# Patient Record
Sex: Female | Born: 1970 | Race: White | Hispanic: No | Smoking: Never smoker
Health system: Southern US, Community
[De-identification: ages and names within clinical notes are randomized; demographics above are authoritative.]

## PROBLEM LIST (undated history)

## (undated) DIAGNOSIS — R87619 Unspecified abnormal cytological findings in specimens from cervix uteri: Secondary | ICD-10-CM

## (undated) DIAGNOSIS — E079 Disorder of thyroid, unspecified: Secondary | ICD-10-CM

## (undated) DIAGNOSIS — O09529 Supervision of elderly multigravida, unspecified trimester: Secondary | ICD-10-CM

## (undated) DIAGNOSIS — F419 Anxiety disorder, unspecified: Secondary | ICD-10-CM

## (undated) DIAGNOSIS — E0590 Thyrotoxicosis, unspecified without thyrotoxic crisis or storm: Secondary | ICD-10-CM

## (undated) DIAGNOSIS — A630 Anogenital (venereal) warts: Secondary | ICD-10-CM

## (undated) DIAGNOSIS — IMO0002 Reserved for concepts with insufficient information to code with codable children: Secondary | ICD-10-CM

## (undated) DIAGNOSIS — N970 Female infertility associated with anovulation: Secondary | ICD-10-CM

## (undated) DIAGNOSIS — Z3189 Encounter for other procreative management: Secondary | ICD-10-CM

## (undated) HISTORY — DX: Disorder of thyroid, unspecified: E07.9

## (undated) HISTORY — DX: Encounter for other procreative management: Z31.89

## (undated) HISTORY — DX: Supervision of elderly multigravida, unspecified trimester: O09.529

## (undated) HISTORY — DX: Reserved for concepts with insufficient information to code with codable children: IMO0002

## (undated) HISTORY — PX: HYSTEROSCOPY: SHX211

## (undated) HISTORY — DX: Anogenital (venereal) warts: A63.0

## (undated) HISTORY — DX: Female infertility associated with anovulation: N97.0

## (undated) HISTORY — DX: Unspecified abnormal cytological findings in specimens from cervix uteri: R87.619

---

## 2005-06-24 ENCOUNTER — Other Ambulatory Visit: Admission: RE | Admit: 2005-06-24 | Discharge: 2005-06-24 | Payer: Self-pay | Admitting: Family Medicine

## 2006-06-09 ENCOUNTER — Emergency Department (HOSPITAL_COMMUNITY): Admission: EM | Admit: 2006-06-09 | Discharge: 2006-06-09 | Payer: Self-pay | Admitting: Emergency Medicine

## 2006-12-26 ENCOUNTER — Other Ambulatory Visit: Admission: RE | Admit: 2006-12-26 | Discharge: 2006-12-26 | Payer: Self-pay | Admitting: Family Medicine

## 2007-02-24 ENCOUNTER — Emergency Department (HOSPITAL_COMMUNITY): Admission: EM | Admit: 2007-02-24 | Discharge: 2007-02-25 | Payer: Self-pay | Admitting: Emergency Medicine

## 2011-02-15 ENCOUNTER — Other Ambulatory Visit (HOSPITAL_COMMUNITY): Payer: Self-pay | Admitting: Obstetrics and Gynecology

## 2011-02-15 DIAGNOSIS — Z1231 Encounter for screening mammogram for malignant neoplasm of breast: Secondary | ICD-10-CM

## 2011-02-26 ENCOUNTER — Ambulatory Visit (HOSPITAL_COMMUNITY)
Admission: RE | Admit: 2011-02-26 | Discharge: 2011-02-26 | Disposition: A | Discharge status: Home / Self Care | Payer: BC Managed Care – PPO | Source: Ambulatory Visit | Attending: Obstetrics and Gynecology | Admitting: Obstetrics and Gynecology

## 2011-02-26 DIAGNOSIS — Z1231 Encounter for screening mammogram for malignant neoplasm of breast: Secondary | ICD-10-CM | POA: Insufficient documentation

## 2011-03-02 ENCOUNTER — Ambulatory Visit (HOSPITAL_COMMUNITY): Payer: Self-pay

## 2011-03-03 ENCOUNTER — Ambulatory Visit (HOSPITAL_COMMUNITY): Payer: Self-pay

## 2011-04-12 DIAGNOSIS — N856 Intrauterine synechiae: Secondary | ICD-10-CM | POA: Insufficient documentation

## 2011-05-03 LAB — RAPID URINE DRUG SCREEN, HOSP PERFORMED
Amphetamines: NOT DETECTED
Barbiturates: NOT DETECTED
Benzodiazepines: POSITIVE — AB
Opiates: NOT DETECTED

## 2011-05-03 LAB — CBC
Hemoglobin: 12.1
RBC: 3.94
WBC: 7.5

## 2011-05-03 LAB — DIFFERENTIAL
Lymphocytes Relative: 19
Lymphs Abs: 1.4
Monocytes Relative: 4
Neutro Abs: 5.7
Neutrophils Relative %: 76

## 2011-05-03 LAB — BASIC METABOLIC PANEL
Calcium: 8.6
Creatinine, Ser: 0.76
GFR calc Af Amer: >60
Sodium: 141

## 2011-05-03 LAB — SALICYLATE LEVEL: Salicylate Lvl: 7.4

## 2011-05-03 LAB — URINALYSIS, ROUTINE W REFLEX MICROSCOPIC
Glucose, UA: NEGATIVE
Ketones, ur: NEGATIVE
Leukocytes, UA: NEGATIVE
pH: 6

## 2011-05-03 LAB — URINE MICROSCOPIC-ADD ON

## 2011-05-03 LAB — ETHANOL: Alcohol, Ethyl (B): 247 — ABNORMAL HIGH

## 2011-08-21 DIAGNOSIS — N978 Female infertility of other origin: Secondary | ICD-10-CM | POA: Insufficient documentation

## 2011-10-13 LAB — OB RESULTS CONSOLE GC/CHLAMYDIA
Chlamydia: NEGATIVE
Gonorrhea: NEGATIVE

## 2011-10-13 LAB — OB RESULTS CONSOLE RPR: RPR: NONREACTIVE

## 2011-10-13 LAB — OB RESULTS CONSOLE ABO/RH: RH Type: POSITIVE

## 2011-11-05 ENCOUNTER — Other Ambulatory Visit: Payer: Self-pay

## 2011-12-10 ENCOUNTER — Other Ambulatory Visit (HOSPITAL_COMMUNITY): Payer: Self-pay | Admitting: Obstetrics & Gynecology

## 2011-12-10 DIAGNOSIS — Z3689 Encounter for other specified antenatal screening: Secondary | ICD-10-CM

## 2011-12-10 DIAGNOSIS — R772 Abnormality of alphafetoprotein: Secondary | ICD-10-CM

## 2011-12-14 ENCOUNTER — Encounter (HOSPITAL_COMMUNITY): Payer: Self-pay

## 2011-12-14 ENCOUNTER — Ambulatory Visit (HOSPITAL_COMMUNITY)
Admission: RE | Admit: 2011-12-14 | Discharge: 2011-12-14 | Disposition: A | Discharge status: Home / Self Care | Payer: BC Managed Care – PPO | Source: Ambulatory Visit | Attending: Obstetrics & Gynecology | Admitting: Obstetrics & Gynecology

## 2011-12-14 ENCOUNTER — Ambulatory Visit (HOSPITAL_COMMUNITY): Admission: RE | Admit: 2011-12-14 | Payer: BC Managed Care – PPO | Source: Ambulatory Visit

## 2011-12-14 DIAGNOSIS — Z363 Encounter for antenatal screening for malformations: Secondary | ICD-10-CM | POA: Insufficient documentation

## 2011-12-14 DIAGNOSIS — O09529 Supervision of elderly multigravida, unspecified trimester: Secondary | ICD-10-CM | POA: Insufficient documentation

## 2011-12-14 DIAGNOSIS — R772 Abnormality of alphafetoprotein: Secondary | ICD-10-CM

## 2011-12-14 DIAGNOSIS — Z3689 Encounter for other specified antenatal screening: Secondary | ICD-10-CM

## 2011-12-14 DIAGNOSIS — Z1389 Encounter for screening for other disorder: Secondary | ICD-10-CM | POA: Insufficient documentation

## 2011-12-14 DIAGNOSIS — O358XX0 Maternal care for other (suspected) fetal abnormality and damage, not applicable or unspecified: Secondary | ICD-10-CM | POA: Insufficient documentation

## 2011-12-14 NOTE — Progress Notes (Signed)
Patient seen today  For detailed fetal ultrasound.  See full report in [facility].  Alpha Gula, MD  Single IUP at 18 0/7 weeks Elevated MSAFP (2.56 MOM) Normal cell free fetal DNA testing Normal detailed anatomic fetal survey.  The spine, posterior fossa, cerebellum and cord insertion well-visualized Normal amniotic fluid volume  Recommend follow up ultrasound for growth in the 3rd trimester due to increased risk for IUGR based on elevated MSAFP.  Please contact us if you would like to schedule the procedure here.

## 2012-04-19 LAB — OB RESULTS CONSOLE GBS: GBS: NEGATIVE

## 2012-05-04 ENCOUNTER — Telehealth (HOSPITAL_COMMUNITY): Payer: Self-pay | Admitting: *Deleted

## 2012-05-04 ENCOUNTER — Encounter (HOSPITAL_COMMUNITY): Payer: Self-pay | Admitting: *Deleted

## 2012-05-04 ENCOUNTER — Other Ambulatory Visit: Payer: Self-pay | Admitting: Obstetrics & Gynecology

## 2012-05-04 NOTE — Telephone Encounter (Signed)
Preadmission screen  

## 2012-05-11 ENCOUNTER — Inpatient Hospital Stay (HOSPITAL_COMMUNITY): Payer: BC Managed Care – PPO | Admitting: Anesthesiology

## 2012-05-11 ENCOUNTER — Encounter (HOSPITAL_COMMUNITY): Payer: Self-pay | Admitting: *Deleted

## 2012-05-11 ENCOUNTER — Inpatient Hospital Stay (HOSPITAL_COMMUNITY)
Admission: AD | Admit: 2012-05-11 | Discharge: 2012-05-13 | DRG: 371 | Disposition: A | Discharge status: Home / Self Care | Payer: BC Managed Care – PPO | Source: Ambulatory Visit | Attending: Obstetrics & Gynecology | Admitting: Obstetrics & Gynecology

## 2012-05-11 ENCOUNTER — Encounter (HOSPITAL_COMMUNITY): Payer: Self-pay | Admitting: Anesthesiology

## 2012-05-11 ENCOUNTER — Encounter (HOSPITAL_COMMUNITY)
Admission: AD | Disposition: A | Discharge status: Home / Self Care | Payer: Self-pay | Source: Ambulatory Visit | Attending: Obstetrics & Gynecology

## 2012-05-11 DIAGNOSIS — O324XX0 Maternal care for high head at term, not applicable or unspecified: Secondary | ICD-10-CM | POA: Diagnosis present

## 2012-05-11 DIAGNOSIS — O09529 Supervision of elderly multigravida, unspecified trimester: Secondary | ICD-10-CM | POA: Diagnosis present

## 2012-05-11 LAB — ABO/RH: ABO/RH(D): O POS

## 2012-05-11 LAB — CBC
HCT: 35.8 % — ABNORMAL LOW (ref 36.0–46.0)
Hemoglobin: 12 g/dL (ref 12.0–15.0)
MCH: 30.2 pg (ref 26.0–34.0)
MCHC: 33.5 g/dL (ref 30.0–36.0)

## 2012-05-11 SURGERY — Surgical Case
Anesthesia: Regional | Site: Abdomen | Wound class: Clean Contaminated

## 2012-05-11 MED ORDER — LACTATED RINGERS IV SOLN: 500.0000 mL | Freq: Once | INTRAVENOUS | Status: DC

## 2012-05-11 MED ORDER — LACTATED RINGERS IV SOLN: 500.0000 mL | INTRAVENOUS | Status: DC | PRN

## 2012-05-11 MED ORDER — HYDROMORPHONE HCL PF 1 MG/ML IJ SOLN: 0.2500 mg | INTRAMUSCULAR | Status: DC | PRN

## 2012-05-11 MED ORDER — METHYLERGONOVINE MALEATE 0.2 MG/ML IJ SOLN: 0.2000 mg | INTRAMUSCULAR | Status: DC | PRN

## 2012-05-11 MED ORDER — LACTATED RINGERS IV SOLN: INTRAVENOUS | Status: DC

## 2012-05-11 MED ORDER — NALOXONE HCL 0.4 MG/ML IJ SOLN: 0.4000 mg | INTRAMUSCULAR | Status: DC | PRN

## 2012-05-11 MED ORDER — BUTORPHANOL TARTRATE 1 MG/ML IJ SOLN
INTRAMUSCULAR | Status: AC
  Filled 2012-05-11: qty 1

## 2012-05-11 MED ORDER — METOCLOPRAMIDE HCL 5 MG/ML IJ SOLN: 10.0000 mg | Freq: Three times a day (TID) | INTRAMUSCULAR | Status: DC | PRN

## 2012-05-11 MED ORDER — NALBUPHINE HCL 10 MG/ML IJ SOLN: 5.0000 mg | INTRAMUSCULAR | Status: DC | PRN

## 2012-05-11 MED ORDER — MORPHINE SULFATE (PF) 0.5 MG/ML IJ SOLN
INTRAMUSCULAR | Status: DC | PRN
  Administered 2012-05-11: 3 ug via EPIDURAL

## 2012-05-11 MED ORDER — LACTATED RINGERS IV SOLN
INTRAVENOUS | Status: DC | PRN
  Administered 2012-05-11: 16:00:00 via INTRAVENOUS

## 2012-05-11 MED ORDER — IBUPROFEN 600 MG PO TABS
600.0000 mg | ORAL_TABLET | Freq: Four times a day (QID) | ORAL | Status: DC
  Administered 2012-05-12 – 2012-05-13 (×6): 600 mg via ORAL
  Filled 2012-05-11 (×6): qty 1

## 2012-05-11 MED ORDER — KETOROLAC TROMETHAMINE 60 MG/2ML IM SOLN
60.0000 mg | Freq: Once | INTRAMUSCULAR | Status: AC | PRN
  Administered 2012-05-11: 60 mg via INTRAMUSCULAR

## 2012-05-11 MED ORDER — KETOROLAC TROMETHAMINE 60 MG/2ML IM SOLN
INTRAMUSCULAR | Status: AC
  Administered 2012-05-11: 60 mg via INTRAMUSCULAR
  Filled 2012-05-11: qty 2

## 2012-05-11 MED ORDER — IBUPROFEN 600 MG PO TABS: 600.0000 mg | ORAL_TABLET | Freq: Four times a day (QID) | ORAL | Status: DC | PRN

## 2012-05-11 MED ORDER — MENTHOL 3 MG MT LOZG: 1.0000 | LOZENGE | OROMUCOSAL | Status: DC | PRN

## 2012-05-11 MED ORDER — OXYTOCIN 40 UNITS IN LACTATED RINGERS INFUSION - SIMPLE MED
1.0000 m[IU]/min | INTRAVENOUS | Status: DC
  Administered 2012-05-11: 2 m[IU]/min via INTRAVENOUS

## 2012-05-11 MED ORDER — KETOROLAC TROMETHAMINE 30 MG/ML IJ SOLN: 30.0000 mg | Freq: Four times a day (QID) | INTRAMUSCULAR | Status: AC | PRN

## 2012-05-11 MED ORDER — EPHEDRINE 5 MG/ML INJ
10.0000 mg | INTRAVENOUS | Status: DC | PRN
  Filled 2012-05-11: qty 4

## 2012-05-11 MED ORDER — TERBUTALINE SULFATE 1 MG/ML IJ SOLN: 0.2500 mg | Freq: Once | INTRAMUSCULAR | Status: DC | PRN

## 2012-05-11 MED ORDER — ONDANSETRON HCL 4 MG/2ML IJ SOLN: 4.0000 mg | Freq: Four times a day (QID) | INTRAMUSCULAR | Status: DC | PRN

## 2012-05-11 MED ORDER — DEXTROSE 5 % IV SOLN
2.0000 g | Freq: Once | INTRAVENOUS | Status: DC
  Filled 2012-05-11: qty 2

## 2012-05-11 MED ORDER — SCOPOLAMINE 1 MG/3DAYS TD PT72
1.0000 | MEDICATED_PATCH | Freq: Once | TRANSDERMAL | Status: DC
  Administered 2012-05-11: 1.5 mg via TRANSDERMAL

## 2012-05-11 MED ORDER — OXYTOCIN 10 UNIT/ML IJ SOLN
INTRAMUSCULAR | Status: AC
  Filled 2012-05-11: qty 4

## 2012-05-11 MED ORDER — SODIUM CHLORIDE 0.9 % IV SOLN: 1.0000 ug/kg/h | INTRAVENOUS | Status: DC | PRN

## 2012-05-11 MED ORDER — PRENATAL MULTIVITAMIN CH
1.0000 | ORAL_TABLET | Freq: Every day | ORAL | Status: DC
  Administered 2012-05-13: 1 via ORAL
  Filled 2012-05-11 (×2): qty 1

## 2012-05-11 MED ORDER — PHENYLEPHRINE 40 MCG/ML (10ML) SYRINGE FOR IV PUSH (FOR BLOOD PRESSURE SUPPORT)
80.0000 ug | PREFILLED_SYRINGE | INTRAVENOUS | Status: DC | PRN
  Filled 2012-05-11: qty 5

## 2012-05-11 MED ORDER — ACETAMINOPHEN 325 MG PO TABS: 650.0000 mg | ORAL_TABLET | ORAL | Status: DC | PRN

## 2012-05-11 MED ORDER — DIBUCAINE 1 % RE OINT: 1.0000 "application " | TOPICAL_OINTMENT | RECTAL | Status: DC | PRN

## 2012-05-11 MED ORDER — CITRIC ACID-SODIUM CITRATE 334-500 MG/5ML PO SOLN
30.0000 mL | ORAL | Status: DC | PRN
  Filled 2012-05-11: qty 15

## 2012-05-11 MED ORDER — DIPHENHYDRAMINE HCL 50 MG/ML IJ SOLN: 25.0000 mg | INTRAMUSCULAR | Status: DC | PRN

## 2012-05-11 MED ORDER — SODIUM BICARBONATE 8.4 % IV SOLN
INTRAVENOUS | Status: DC | PRN
  Administered 2012-05-11: 5 mL via EPIDURAL

## 2012-05-11 MED ORDER — TETANUS-DIPHTH-ACELL PERTUSSIS 5-2.5-18.5 LF-MCG/0.5 IM SUSP: 0.5000 mL | Freq: Once | INTRAMUSCULAR | Status: DC

## 2012-05-11 MED ORDER — SCOPOLAMINE 1 MG/3DAYS TD PT72
MEDICATED_PATCH | TRANSDERMAL | Status: AC
  Administered 2012-05-11: 1.5 mg via TRANSDERMAL
  Filled 2012-05-11: qty 1

## 2012-05-11 MED ORDER — LIDOCAINE HCL (PF) 1 % IJ SOLN: 30.0000 mL | INTRAMUSCULAR | Status: DC | PRN

## 2012-05-11 MED ORDER — PHENYLEPHRINE HCL 10 MG/ML IJ SOLN
INTRAMUSCULAR | Status: DC | PRN
  Administered 2012-05-11: 80 ug via INTRAVENOUS

## 2012-05-11 MED ORDER — SIMETHICONE 80 MG PO CHEW: 80.0000 mg | CHEWABLE_TABLET | ORAL | Status: DC | PRN

## 2012-05-11 MED ORDER — MORPHINE SULFATE 0.5 MG/ML IJ SOLN
INTRAMUSCULAR | Status: AC
  Filled 2012-05-11: qty 10

## 2012-05-11 MED ORDER — SIMETHICONE 80 MG PO CHEW
80.0000 mg | CHEWABLE_TABLET | Freq: Three times a day (TID) | ORAL | Status: DC
  Administered 2012-05-11 – 2012-05-13 (×6): 80 mg via ORAL

## 2012-05-11 MED ORDER — FENTANYL 2.5 MCG/ML BUPIVACAINE 1/10 % EPIDURAL INFUSION (WH - ANES)
INTRAMUSCULAR | Status: DC | PRN
  Administered 2012-05-11: 14 mL/h via EPIDURAL

## 2012-05-11 MED ORDER — OXYCODONE-ACETAMINOPHEN 5-325 MG PO TABS
1.0000 | ORAL_TABLET | ORAL | Status: DC | PRN
  Administered 2012-05-12 – 2012-05-13 (×3): 2 via ORAL
  Filled 2012-05-11 (×3): qty 2

## 2012-05-11 MED ORDER — DIPHENHYDRAMINE HCL 25 MG PO CAPS: 25.0000 mg | ORAL_CAPSULE | Freq: Four times a day (QID) | ORAL | Status: DC | PRN

## 2012-05-11 MED ORDER — BUTORPHANOL TARTRATE 1 MG/ML IJ SOLN: 1.0000 mg | Freq: Once | INTRAMUSCULAR | Status: DC

## 2012-05-11 MED ORDER — ZOLPIDEM TARTRATE 5 MG PO TABS: 5.0000 mg | ORAL_TABLET | Freq: Every evening | ORAL | Status: DC | PRN

## 2012-05-11 MED ORDER — METHYLERGONOVINE MALEATE 0.2 MG PO TABS: 0.2000 mg | ORAL_TABLET | ORAL | Status: DC | PRN

## 2012-05-11 MED ORDER — LANOLIN HYDROUS EX OINT: 1.0000 "application " | TOPICAL_OINTMENT | CUTANEOUS | Status: DC | PRN

## 2012-05-11 MED ORDER — OXYTOCIN 40 UNITS IN LACTATED RINGERS INFUSION - SIMPLE MED
62.5000 mL/h | INTRAVENOUS | Status: DC
  Filled 2012-05-11: qty 1000

## 2012-05-11 MED ORDER — BUPIVACAINE HCL (PF) 0.25 % IJ SOLN
INTRAMUSCULAR | Status: DC | PRN
  Administered 2012-05-11: 10 mL

## 2012-05-11 MED ORDER — FLEET ENEMA 7-19 GM/118ML RE ENEM: 1.0000 | ENEMA | RECTAL | Status: DC | PRN

## 2012-05-11 MED ORDER — SENNOSIDES-DOCUSATE SODIUM 8.6-50 MG PO TABS
2.0000 | ORAL_TABLET | Freq: Every day | ORAL | Status: DC
  Administered 2012-05-11 – 2012-05-12 (×2): 2 via ORAL

## 2012-05-11 MED ORDER — LACTATED RINGERS IV SOLN
INTRAVENOUS | Status: DC | PRN
  Administered 2012-05-11 (×2): via INTRAVENOUS

## 2012-05-11 MED ORDER — OXYTOCIN BOLUS FROM INFUSION
500.0000 mL | INTRAVENOUS | Status: DC
  Filled 2012-05-11 (×64): qty 500

## 2012-05-11 MED ORDER — DEXTROSE 5 % IV SOLN
2.0000 g | INTRAVENOUS | Status: DC | PRN
  Administered 2012-05-11: 2 g via INTRAVENOUS

## 2012-05-11 MED ORDER — DIPHENHYDRAMINE HCL 50 MG/ML IJ SOLN: 12.5000 mg | INTRAMUSCULAR | Status: DC | PRN

## 2012-05-11 MED ORDER — LACTATED RINGERS IV SOLN
INTRAVENOUS | Status: DC
  Administered 2012-05-11: 05:00:00 via INTRAVENOUS
  Administered 2012-05-11: 700 mL via INTRAVENOUS
  Administered 2012-05-11: 1000 mL via INTRAVENOUS

## 2012-05-11 MED ORDER — DIPHENHYDRAMINE HCL 25 MG PO CAPS: 25.0000 mg | ORAL_CAPSULE | ORAL | Status: DC | PRN

## 2012-05-11 MED ORDER — OXYTOCIN 40 UNITS IN LACTATED RINGERS INFUSION - SIMPLE MED: 62.5000 mL/h | INTRAVENOUS | Status: AC

## 2012-05-11 MED ORDER — ONDANSETRON HCL 4 MG/2ML IJ SOLN: 4.0000 mg | Freq: Three times a day (TID) | INTRAMUSCULAR | Status: DC | PRN

## 2012-05-11 MED ORDER — MEPERIDINE HCL 25 MG/ML IJ SOLN: 6.2500 mg | INTRAMUSCULAR | Status: DC | PRN

## 2012-05-11 MED ORDER — WITCH HAZEL-GLYCERIN EX PADS: 1.0000 "application " | MEDICATED_PAD | CUTANEOUS | Status: DC | PRN

## 2012-05-11 MED ORDER — BUPIVACAINE HCL (PF) 0.25 % IJ SOLN
INTRAMUSCULAR | Status: AC
  Filled 2012-05-11: qty 30

## 2012-05-11 MED ORDER — ONDANSETRON HCL 4 MG/2ML IJ SOLN: 4.0000 mg | INTRAMUSCULAR | Status: DC | PRN

## 2012-05-11 MED ORDER — EPHEDRINE 5 MG/ML INJ: 10.0000 mg | INTRAVENOUS | Status: DC | PRN

## 2012-05-11 MED ORDER — OXYCODONE-ACETAMINOPHEN 5-325 MG PO TABS: 1.0000 | ORAL_TABLET | ORAL | Status: DC | PRN

## 2012-05-11 MED ORDER — FENTANYL 2.5 MCG/ML BUPIVACAINE 1/10 % EPIDURAL INFUSION (WH - ANES)
14.0000 mL/h | INTRAMUSCULAR | Status: DC
  Administered 2012-05-11: 14 mL/h via EPIDURAL
  Filled 2012-05-11 (×2): qty 125

## 2012-05-11 MED ORDER — SODIUM CHLORIDE 0.9 % IJ SOLN: 3.0000 mL | INTRAMUSCULAR | Status: DC | PRN

## 2012-05-11 MED ORDER — ONDANSETRON HCL 4 MG PO TABS: 4.0000 mg | ORAL_TABLET | ORAL | Status: DC | PRN

## 2012-05-11 MED ORDER — ONDANSETRON HCL 4 MG/2ML IJ SOLN
INTRAMUSCULAR | Status: AC
  Filled 2012-05-11: qty 2

## 2012-05-11 MED ORDER — PHENYLEPHRINE 40 MCG/ML (10ML) SYRINGE FOR IV PUSH (FOR BLOOD PRESSURE SUPPORT): 80.0000 ug | PREFILLED_SYRINGE | INTRAVENOUS | Status: DC | PRN

## 2012-05-11 MED ORDER — ONDANSETRON HCL 4 MG/2ML IJ SOLN
INTRAMUSCULAR | Status: DC | PRN
  Administered 2012-05-11: 4 mg via INTRAVENOUS

## 2012-05-11 MED ORDER — OXYTOCIN 10 UNIT/ML IJ SOLN
40.0000 [IU] | INTRAVENOUS | Status: DC | PRN
  Administered 2012-05-11: 40 [IU] via INTRAVENOUS

## 2012-05-11 MED ORDER — PHENYLEPHRINE 40 MCG/ML (10ML) SYRINGE FOR IV PUSH (FOR BLOOD PRESSURE SUPPORT)
PREFILLED_SYRINGE | INTRAVENOUS | Status: AC
  Filled 2012-05-11: qty 5

## 2012-05-11 SURGICAL SUPPLY — 32 items
CLOTH BEACON ORANGE TIMEOUT ST (SAFETY) ×2 IMPLANT
CONTAINER PREFILL 10% NBF 15ML (MISCELLANEOUS) IMPLANT
DRAPE SURG 17X23 STRL (DRAPES) ×2 IMPLANT
DRESSING TELFA 8X3 (GAUZE/BANDAGES/DRESSINGS) IMPLANT
DRSG COVADERM 4X10 (GAUZE/BANDAGES/DRESSINGS) ×4 IMPLANT
DURAPREP 26ML APPLICATOR (WOUND CARE) ×2 IMPLANT
ELECT REM PT RETURN 9FT ADLT (ELECTROSURGICAL) ×2
ELECTRODE REM PT RTRN 9FT ADLT (ELECTROSURGICAL) ×1 IMPLANT
EXTRACTOR VACUUM M CUP 4 TUBE (SUCTIONS) IMPLANT
GAUZE SPONGE 4X4 12PLY STRL LF (GAUZE/BANDAGES/DRESSINGS) ×4 IMPLANT
GLOVE BIO SURGEON STRL SZ7.5 (GLOVE) ×4 IMPLANT
GOWN PREVENTION PLUS LG XLONG (DISPOSABLE) ×4 IMPLANT
GOWN PREVENTION PLUS XLARGE (GOWN DISPOSABLE) ×2 IMPLANT
KIT ABG SYR 3ML LUER SLIP (SYRINGE) IMPLANT
NEEDLE HYPO 25X1 1.5 SAFETY (NEEDLE) ×2 IMPLANT
NEEDLE HYPO 25X5/8 SAFETYGLIDE (NEEDLE) IMPLANT
NS IRRIG 1000ML POUR BTL (IV SOLUTION) ×2 IMPLANT
PACK C SECTION WH (CUSTOM PROCEDURE TRAY) ×2 IMPLANT
PAD ABD 7.5X8 STRL (GAUZE/BANDAGES/DRESSINGS) IMPLANT
PAD OB MATERNITY 4.3X12.25 (PERSONAL CARE ITEMS) IMPLANT
SLEEVE SCD COMPRESS KNEE MED (MISCELLANEOUS) IMPLANT
STAPLER VISISTAT 35W (STAPLE) ×2 IMPLANT
SUT MNCRL 0 VIOLET CTX 36 (SUTURE) ×2 IMPLANT
SUT MON AB 2-0 CT1 27 (SUTURE) ×2 IMPLANT
SUT MON AB-0 CT1 36 (SUTURE) ×4 IMPLANT
SUT MONOCRYL 0 CTX 36 (SUTURE) ×2
SUT PLAIN 0 NONE (SUTURE) IMPLANT
SUT PLAIN 2 0 XLH (SUTURE) ×2 IMPLANT
SYR CONTROL 10ML LL (SYRINGE) ×2 IMPLANT
TOWEL OR 17X24 6PK STRL BLUE (TOWEL DISPOSABLE) ×4 IMPLANT
TRAY FOLEY CATH 14FR (SET/KITS/TRAYS/PACK) ×2 IMPLANT
WATER STERILE IRR 1000ML POUR (IV SOLUTION) ×2 IMPLANT

## 2012-05-11 NOTE — MAU Note (Addendum)
PT SAYS YESTERDAY AT WORK AT 145PM - FELT GUSH -  WENT TO DR OFFICE-  SAID NOT SROM.   THEN TONIGHT AT 0200- FELT MORE FLUID LEAKING.

## 2012-05-11 NOTE — H&P (Signed)
Subjective:  Lori Shepherd is a 41 y.o. G2 P0 female with EDC 05/13/2012 at 29 and 5/[redacted] weeks gestation who is being admitted for labor management.  SROM clear AF at 2 am.  Her current obstetrical history is significant for advanced maternal age.  Patient reports no complaints.   Fetal Movement: normal.     Objective:   Vital signs in last 24 hours: Temp:  [97.1 F (36.2 C)-98.3 F (36.8 C)] 97.8 F (36.6 C) (10/24 1013) Pulse Rate:  [63-90] 63  (10/24 0901) Resp:  [18-20] 18  (10/24 1013) BP: (81-130)/(49-81) 92/49 mmHg (10/24 0901) SpO2:  [100 %] 100 % (10/24 0822) Weight:  [69.174 kg (152 lb 8 oz)-70.308 kg (155 lb)] 70.308 kg (155 lb) (10/24 0716)   General:   alert  Skin:   normal  HEENT:  PERRLA  Lungs:   clear to auscultation bilaterally  Heart:   regular rate and rhythm, S1, S2 normal, no murmur, click, rub or gallop  Breasts:   normal without suspicious masses, skin or nipple changes or axillary nodes  Abdomen:  soft, non-tender; bowel sounds normal; no masses,  no organomegaly  Pelvis:  Exam deferred.  FHT:  130's BPM, accelerations present, no deceleration  Uterine Size: size equals dates  Presentations: cephalic  Cervix:    Dilation: 8+   Effacement: 100%   Station:  +1   Consistency: soft   Position: anterior                                                            Rt post occiput.                                                           AF clear  Lab Review  Rh+  Harmony neg.  Female fetus.  AFP1 neg.  [place] MFM wnl.  One hour GTT: elevated with normal 3 hour GTT   GBS neg  Assessment/Plan:  39 and 5/[redacted] weeks gestation. Active phase labor.  SROM.  No sign of infection.  Fetal well-being reassuring. Obstetrical history significant for advanced maternal age.     Risks, benefits, alternatives and possible complications have been discussed in detail with the patient.  Pre-admission, admission, and post admission procedures and expectations were discussed in  detail.  All questions answered, all appropriate consents will be signed at the Hospital. Admission is planned for today.  Anticipate vaginal delivery.

## 2012-05-11 NOTE — Transfer of Care (Signed)
Immediate Anesthesia Transfer of Care Note  Patient: Lori Shepherd  Procedure(s) Performed: Procedure(s) (LRB) with comments: CESAREAN SECTION (N/A)  Patient Location: PACU  Anesthesia Type: Epidural  Level of Consciousness: sedated  Airway & Oxygen Therapy: Patient Spontanous Breathing  Post-op Assessment: Report given to PACU RN  Post vital signs: Reviewed and stable  Complications: No apparent anesthesia complications

## 2012-05-11 NOTE — Anesthesia Postprocedure Evaluation (Signed)
  Anesthesia Post-op Note  Patient: Lori Shepherd  Procedure(s) Performed: Procedure(s) (LRB) with comments: CESAREAN SECTION (N/A)   Patient is awake, responsive, moving her legs, and has signs of resolution of her numbness. Pain and nausea are reasonably well controlled. Vital signs are stable and clinically acceptable. Oxygen saturation is clinically acceptable. There are no apparent anesthetic complications at this time. Patient is ready for discharge.

## 2012-05-11 NOTE — Progress Notes (Signed)
CORRA BENCH is a 41 y.o. G2P0010 at [redacted]w[redacted]d by LMP admitted for active labor  Subjective: Complete since 12noon.  Objective: BP 159/117  Pulse 104  Temp 97.4 F (36.3 C) (Axillary)  Resp 18  Ht 5\' 3"  (1.6 m)  Wt 70.308 kg (155 lb)  BMI 27.46 kg/m2  SpO2 100%  LMP 08/10/2011  Breastfeeding? Unknown   Total I/O In: -  Out: 350 [Urine:350]  FHT:  FHR: 145 bpm, variability: moderate,  accelerations:  Present,  decelerations:  Absent UC:   regular, every 2-3 minutes SVE:   Dilation: 10 Effacement (%): 100 Station: +1 Exam by:: Booker Bhatnagar OP presentation NO descent x one hr   Labs: Lab Results  Component Value Date   WBC 11.0* 05/11/2012   HGB 12.0 05/11/2012   HCT 35.8* 05/11/2012   MCV 90.2 05/11/2012   PLT 150 05/11/2012    Assessment / Plan: Dysfunctional prolonged second stage due to presentation. Minimal descent noted  Labor: Continue pushing , continue Pitocin Preeclampsia:  na Fetal Wellbeing:  Category I Pain Control:  Epidural I/D:  n/a Anticipated MOD:  Will continue pushing at this time. Reassess in one hr.  Delvis Kau J 05/11/2012, 2:31 PM

## 2012-05-11 NOTE — Anesthesia Preprocedure Evaluation (Signed)

## 2012-05-11 NOTE — MAU Note (Signed)
SAYS HURT BAD AT 0200.  WAS IN OFFICE TODAY- VE 2 CM.    DENIES HSV AND MRSA.Marland Kitchen  UNSURE IF SROM

## 2012-05-11 NOTE — Progress Notes (Signed)
Lori Shepherd is a 41 y.o. G2P0010 at [redacted]w[redacted]d by LMP admitted for active labor  Subjective: Complete since 12noon Pushing on and off x 3 hrs  Objective: BP 172/150  Pulse 125  Temp 98.3 F (36.8 C) (Oral)  Resp 18  Ht 5\' 3"  (1.6 m)  Wt 70.308 kg (155 lb)  BMI 27.46 kg/m2  SpO2 100%  LMP 08/10/2011  Breastfeeding? Unknown   Total I/O In: -  Out: 350 [Urine:350]  FHT:  FHR: 145 bpm, variability: moderate,  accelerations:  Present,  decelerations:  Present mild variables with pushing UC:   regular, every 2 minutes SVE:   Dilation: 10 Effacement (%): 100 Station: +1 Exam by:: Lori Shepherd No change in descent  Labs: Lab Results  Component Value Date   WBC 11.0* 05/11/2012   HGB 12.0 05/11/2012   HCT 35.8* 05/11/2012   MCV 90.2 05/11/2012   PLT 150 05/11/2012    Assessment / Plan: Arrest of decent  Labor: failure to descend, OP Preeclampsia:  na Fetal Wellbeing:  Category I and Category II Pain Control:  Epidural I/D:  n/a Anticipated MOD:  Proceed with csection Risks vs benefits discussed. Consent done. OR notified.  Lori Shepherd J 05/11/2012, 3:34 PM

## 2012-05-11 NOTE — Anesthesia Postprocedure Evaluation (Signed)
  Anesthesia Post-op Note  Patient: Lori Shepherd  Procedure(s) Performed: Procedure(s) (LRB) with comments: CESAREAN SECTION (N/A)   Patient is awake, responsive, moving her legs, and has signs of resolution of her numbness. Pain and nausea are reasonably well controlled. Vital signs are stable and clinically acceptable. Oxygen saturation is clinically acceptable. There are no apparent anesthetic complications at this time. Patient is ready for discharge.  

## 2012-05-11 NOTE — Op Note (Signed)
Cesarean Section Procedure Note  Indications: failure to progress: arrest of descent Occiput Posterior  Pre-operative Diagnosis: 39 week 5 day pregnancy.  Post-operative Diagnosis: same  Surgeon: Lenoard Aden   Assistants: none  Anesthesia: Epidural anesthesia and Local anesthesia 0.25.% bupivacaine  ASA Class: 2  Procedure Details  The patient was seen in the Holding Room. The risks, benefits, complications, treatment options, and expected outcomes were discussed with the patient.  The patient concurred with the proposed plan, giving informed consent. The risks of anesthesia, infection, bleeding and possible injury to other organs discussed. Injury to bowel, bladder, or ureter with possible need for repair discussed. Possible need for transfusion with secondary risks of hepatitis or HIV acquisition discussed. Post operative complications to include but not limited to DVT, PE and Pneumonia noted. The site of surgery properly noted/marked. The patient was taken to Operating Room # 1, identified as Lori Shepherd and the procedure verified as C-Section Delivery. A Time Out was held and the above information confirmed.  After induction of anesthesia, the patient was draped and prepped in the usual sterile manner. A Pfannenstiel incision was made and carried down through the subcutaneous tissue to the fascia. Fascial incision was made and extended transversely using Mayo scissors. The fascia was separated from the underlying rectus tissue superiorly and inferiorly. The peritoneum was identified and entered. Peritoneal incision was extended longitudinally. The utero-vesical peritoneal reflection was incised transversely and the bladder flap was bluntly freed from the lower uterine segment. A low transverse uterine incision(Kerr hysterotomy) was made. Delivered from OP presentation was a  female with Apgar scores of 8 at one minute and 9 at five minutes. Bulb suctioning gently performed. Neonatal  team in attendance.After the umbilical cord was clamped and cut cord blood was obtained for evaluation. The placenta was removed intact and appeared normal. The uterus was curetted with a dry lap pack. Good hemostasis was noted.The uterine outline, tubes and ovaries appeared normal. Right paratubal cyst excised.  The uterine incision was closed with running locked sutures of 0 Monocryl x 2 layers. Cervical extension on right.  Hemostasis was observed. Lavage was carried out until clear.The parietal peritoneum was closed with a running 2-0 Monocryl suture. The fascia was then reapproximated with running sutures of 0 Monocryl. The skin was reapproximated with staples.  Instrument, sponge, and needle counts were correct prior the abdominal closure and at the conclusion of the case.   Findings: OP presentation Right paratubal cyst  Estimated Blood Loss:  500         Drains: foley                 Specimens: placenta and paratubal cyst                 Complications:  None; patient tolerated the procedure well.         Disposition: PACU - hemodynamically stable.         Condition: stable  Attending Attestation: I performed the procedure.

## 2012-05-11 NOTE — Anesthesia Procedure Notes (Signed)

## 2012-05-12 ENCOUNTER — Encounter (HOSPITAL_COMMUNITY): Payer: Self-pay | Admitting: Obstetrics and Gynecology

## 2012-05-12 LAB — CBC
MCHC: 34.4 g/dL (ref 30.0–36.0)
MCV: 90.5 fL (ref 78.0–100.0)
Platelets: 142 10*3/uL — ABNORMAL LOW (ref 150–400)
RDW: 14.1 % (ref 11.5–15.5)
WBC: 16.2 10*3/uL — ABNORMAL HIGH (ref 4.0–10.5)

## 2012-05-12 NOTE — Addendum Note (Signed)
Addendum  created 05/12/12 0804 by Jhonnie Garner, CRNA   Modules edited:Notes Section

## 2012-05-12 NOTE — Progress Notes (Signed)
Patient ID: Lori Shepherd, female   DOB: 1971/04/30, 41 y.o.   MRN: [id number] POD # 1  Subjective: Pt reports feeling well/Denies SOB, CP/ Pain controlled with ibuprofen Tolerating po/ Foley d/c'ed and pt voiding without problems/ No n/v/Flatus pos Activity: out of bed and ambulate Bleeding is moderate; notes sm amt bleeding when out of bed and noted in urine Newborn info:  Information for the patient's newborn:  Tanda, Morrissey [[id number]]  female  / circ this am per Dr Billy Coast Feeding: breast   Objective: VS: Blood pressure 96/51, pulse 67, temperature 97.8 F (36.6 C), temperature source Oral, resp. rate 16.    Intake/Output Summary (Last 24 hours) at 05/12/12 1045 Last data filed at 05/12/12 0700  Gross per 24 hour  Intake   3400 ml  Output   3425 ml  Net    -25 ml      Basename 05/12/12 0510 05/11/12 0500  WBC 16.2* 11.0*  HGB 9.8* 12.0  HCT 28.5* 35.8*  PLT 142* 150    Blood type: --/--/O POS (10/24 0500) Rubella: Immune (03/27 0000)    Physical Exam:  General: alert, cooperative and no distress CV: Regular rate and rhythm Resp: clear Abdomen: soft, nontender, normal bowel sounds Incision: covered with dsg/ C/D/I Uterine Fundus: firm, below umbilicus, nontender Lochia: moderate Ext: Homans sign is negative, no sign of DVT and no edema, redness or tenderness in the calves or thighs    A/P: POD # 1/ G2P1011 S/P Primary C/Section d/t failure to progress Moderately hypotensive; historically low BP's however, lower than normal.  No evidence of internal bleeding and HR stable; Hgb stable; will continue to monitor. Doing well Continue routine post op orders   Signed: Demetrius Revel, MSN, [facility] 05/12/2012, 10:45 AM

## 2012-05-12 NOTE — Anesthesia Postprocedure Evaluation (Signed)
Anesthesia Post Note  Patient: Lori Shepherd  Procedure(s) Performed: Procedure(s) (LRB): CESAREAN SECTION (N/A)  Anesthesia type: Epidural  Patient location: Mother/Baby  Post pain: Pain level controlled  Post assessment: Post-op Vital signs reviewed  Last Vitals:  Filed Vitals:   05/12/12 0425  BP: 94/55  Pulse: 80  Temp: 36.8 C  Resp: 16    Post vital signs: Reviewed  Level of consciousness: awake  Complications: No apparent anesthesia complications

## 2012-05-13 ENCOUNTER — Inpatient Hospital Stay (HOSPITAL_COMMUNITY): Admission: RE | Admit: 2012-05-13 | Payer: BC Managed Care – PPO | Source: Ambulatory Visit

## 2012-05-13 MED ORDER — IBUPROFEN 800 MG PO TABS: 800.0000 mg | ORAL_TABLET | Freq: Three times a day (TID) | ORAL | Status: DC | PRN

## 2012-05-13 MED ORDER — OXYCODONE-ACETAMINOPHEN 5-325 MG PO TABS: 1.0000 | ORAL_TABLET | ORAL | Status: DC | PRN

## 2012-05-13 NOTE — Discharge Summary (Signed)
POSTOPERATIVE DISCHARGE SUMMARY:  Patient ID: Lori Shepherd MRN: [id number] DOB/AGE: Dec 14, 1970 41 y.o.  Admit date: 05/11/2012 Admission Diagnoses: Active labor at 39 weeks    Discharge date:  05/13/2012 Discharge Diagnoses: POD 2 s/p cesarean section - arrest of descent  Prenatal history: G2P1011   EDC : 05/13/2012, by Other Basis  Prenatal care at [facility]  Primary provider : [place] Prenatal course complicated by [facility] pregnancy.   Prenatal Labs: ABO, Rh: O (03/27 0000) positive Antibody: Negative (03/27 0000) Rubella: Immune (03/27 0000)  RPR: NON REACTIVE (10/24 0500)  HBsAg: Negative (03/27 0000)  HIV: Non-reactive (03/27 0000)  GBS: Negative (10/02 0000)  1 hr Glucola : NL   Medical / Surgical History :  Past medical history:  Past Medical History  Diagnosis Date  . Artificial insemination   . AMA (advanced maternal age) multigravida 35+   . Abnormal Pap smear   . Condyloma   . Female infertility associated with anovulation   . Postpartum care following cesarean delivery (05/11/12) 05/12/2012    Past surgical history:  Past Surgical History  Procedure Date  . Hysteroscopy   . Cesarean section 05/11/2012    Procedure: CESAREAN SECTION;  Surgeon: Lenoard Aden, MD;  Location: [facility];  Service: Obstetrics;  Laterality: N/A;    Family History:  Family History  Problem Relation Age of Onset  . Hypertension Mother   . Migraines Mother   . Cancer Mother     melanoma  . Rheum arthritis Father   . Urolithiasis Father   . Diabetes Brother   . Cancer Paternal Grandmother     lung  . Cancer Paternal Grandfather     lung  . Heart attack Paternal Grandfather     Social History:  reports that she has never smoked. She has never used smokeless tobacco. She reports that she does not drink alcohol or use illicit drugs.   Allergies: Review of patient's allergies indicates no known allergies.    Current Medications  at time of admission:  Prior to Admission medications   Medication Sig Start Date End Date Taking? Authorizing Provider  Multiple Vitamin (MULTIVITAMIN) tablet Take 1 tablet by mouth daily.   Yes Historical Provider, MD                  Intrapartum Course: admit in active labor with progression to complete dilation / epidural anesthesia / arrest of descent requiring cesarean delivery.  Procedures: Cesarean section delivery of female newborn by Dr Billy Coast  See operative report for further details APGAR (1 MIN): 9   APGAR (5 MINS): 9    Postoperative / postpartum course: uneventful - discharge POD 2  Physical Exam:   VSS: Temp:  [97.2 F (36.2 C)-98.1 F (36.7 C)] 97.2 F (36.2 C) (10/26 0645) Pulse Rate:  [72-76] 73  (10/26 0645) Resp:  [16-18] 18  (10/26 0645) BP: (90-98)/(53-76) 98/64 mmHg (10/26 0645) SpO2:  [100 %] 100 % (10/25 1700)  LABS:  Basename 05/12/12 0510 05/11/12 0500  WBC 16.2* 11.0*  HGB 9.8* 12.0  PLT 142* 150    General:pleasant  / NAD Heart:RRR Lungs:clear Abdomen: soft / active BS / non-distended Extremities: trace  Incision:  approximated with staples / no erythema / no ecchymosis / no drainage Staples: intact for removal at [facility] on Monday  Discharge Instructions:  Discharged Condition: stable Activity: pelvic rest and postoperative restrictions x 2 weeks Diet: routine Medications: see below   Medication List  As of 05/13/2012 11:27 AM    TAKE these medications         ibuprofen 800 MG tablet   Commonly known as: ADVIL,MOTRIN   Take 1 tablet (800 mg total) by mouth every 8 (eight) hours as needed for pain.      multivitamin tablet   Take 1 tablet by mouth daily.      oxyCODONE-acetaminophen 5-325 MG per tablet   Commonly known as: PERCOCET/ROXICET   Take 1 tablet by mouth every 4 (four) hours as needed (moderate - severe pain).        Postpartum Instructions: [facility] discharge booklet - instructions reviewed Discharge to:  Home  Follow up :   [facility] in 6 weeks for routine postpartum visit                      Interval visit at [facility]  in 2 days for staple removal.  Signed: Marlinda Mike CNM, MSN 05/13/2012, 11:27 AM

## 2012-05-13 NOTE — Discharge Summary (Signed)
Reviewed and agree with note and plan. V.Roann Merk, MD  

## 2012-05-13 NOTE — Progress Notes (Signed)
POSTOPERATIVE DAY # 2 S/P cesarean section   S:         Reports feeling tired - not able to rest due to mulitple staff interruptions during the night             Tolerating po intake / no nausea / no vomiting / +flatus / no BM             Bleeding is light             Pain controlled with motrin and percocet             Up ad lib / ambulatory/ voiding QS  Newborn breast feeding  / Circumcision done   O:  VS: BP 98/64  Pulse 73  Temp 97.2 F (36.2 C) (Oral)  Resp 18  Ht 5\' 3"  (1.6 m)  Wt 70.308 kg (155 lb)  BMI 27.46 kg/m2  SpO2 100%  LMP 08/10/2011  Breastfeeding? Unknown   LABS:  Basename 05/12/12 0510 05/11/12 0500  WBC 16.2* 11.0*  HGB 9.8* 12.0  PLT 142* 150                           I&O:                          I/O last 3 completed shifts: In: 1200 [Other:1200] Out: 2250 [Urine:2250]             Physical Exam:             Alert and Oriented X3  Lungs: Clear and unlabored  Heart: regular rate and rhythm / no mumurs  Abdomen: soft, non-tender, non-distended, active BS             Fundus: firm, non-tender, U-1             Dressing OFF              Incision:  approximated with staples / no erythema / no ecchymosis / no drainage  Extremities: trace edema, no calf pain or tenderness  A:        POD # 2 S/P cesarean section            Stable for early discharge  P:        Routine postoperative care              Discharge home - staple removal AT [facility] Monday             [facility] booklet - instructions reviewed     Marlinda Mike CNM, MSN 05/13/2012, 11:21 AM

## 2012-10-30 ENCOUNTER — Ambulatory Visit (INDEPENDENT_AMBULATORY_CARE_PROVIDER_SITE_OTHER): Payer: BC Managed Care – PPO | Admitting: Physician Assistant

## 2012-10-30 VITALS — BP 110/74 | HR 60 | Temp 98.6°F | Resp 16 | Ht 64.5 in | Wt 127.0 lb

## 2012-10-30 DIAGNOSIS — M654 Radial styloid tenosynovitis [de Quervain]: Secondary | ICD-10-CM

## 2012-10-30 DIAGNOSIS — M79609 Pain in unspecified limb: Secondary | ICD-10-CM

## 2012-10-30 MED ORDER — NAPROXEN 500 MG PO TABS: 500.0000 mg | ORAL_TABLET | Freq: Two times a day (BID) | ORAL | Status: DC

## 2012-10-30 NOTE — Progress Notes (Signed)
  Subjective:    Patient ID: Lori Shepherd, female    DOB: 21-May-1971, 42 y.o.   MRN: [id number]  HPI pain in radial aspect of R wrist. She is a single mom with a 100 month old son.  She has esp noticed that it started after she had him and has to pick him up all the time.  She is a Midwife.  The L is also affected.  Intermittent wearing of OTC splints have not helped.  R has become much worse over the last week.  She denies paresthesias.  The pain is bad at night but not in distribution of cubital or carpal tunnel. No weakness.  Review of Systems  All other systems reviewed and are negative.       Objective:   Physical Exam  Nursing note and vitals reviewed. Constitutional: She is oriented to person, place, and time. She appears well-developed and well-nourished.  HENT:  Head: Normocephalic and atraumatic.  Right Ear: External ear normal.  Left Ear: External ear normal.  Neck: Normal range of motion. Neck supple.  Cardiovascular: Normal rate, regular rhythm and normal heart sounds.   Pulmonary/Chest: Effort normal and breath sounds normal.  Musculoskeletal: She exhibits tenderness. She exhibits no edema.  R and L hands/wrists examined.  TTP over radial tendon as it leads to the thumb B, R>>L. +finkelstein's on R.  Otherwise full S&ROM of both hands and wrists.  Neg tinels and Phalens. Normal grip B.  Neurological: She is alert and oriented to person, place, and time.  Skin: Skin is warm and dry.  Psychiatric: She has a normal mood and affect. Her behavior is normal.          Assessment & Plan:  B De Quervain's tenosynovitis- B thumb spica splints 24/7 X 2 weeks then hs. Meds ordered this encounter  Medications  . naproxen (NAPROSYN) 500 MG tablet    Sig: Take 1 tablet (500 mg total) by mouth 2 (two) times daily with a meal. X10days then prn    Dispense:  60 tablet    Refill:  1    Order Specific Question:  Supervising Provider    Answer:  DOOLITTLE, ROBERT P  [[id number]

## 2012-10-30 NOTE — Patient Instructions (Addendum)
De Quervain's tenosynovitis h/o given

## 2012-12-07 ENCOUNTER — Ambulatory Visit (INDEPENDENT_AMBULATORY_CARE_PROVIDER_SITE_OTHER): Payer: BC Managed Care – PPO | Admitting: Family Medicine

## 2012-12-07 ENCOUNTER — Ambulatory Visit: Payer: BC Managed Care – PPO

## 2012-12-07 VITALS — BP 98/68 | HR 63 | Temp 98.3°F | Resp 16 | Ht 64.0 in | Wt 127.0 lb

## 2012-12-07 DIAGNOSIS — M778 Other enthesopathies, not elsewhere classified: Secondary | ICD-10-CM

## 2012-12-07 DIAGNOSIS — M25539 Pain in unspecified wrist: Secondary | ICD-10-CM

## 2012-12-07 DIAGNOSIS — M65839 Other synovitis and tenosynovitis, unspecified forearm: Secondary | ICD-10-CM

## 2012-12-07 DIAGNOSIS — M65849 Other synovitis and tenosynovitis, unspecified hand: Secondary | ICD-10-CM

## 2012-12-07 MED ORDER — TRAMADOL HCL 50 MG PO TABS: 50.0000 mg | ORAL_TABLET | Freq: Three times a day (TID) | ORAL | Status: DC | PRN

## 2012-12-07 MED ORDER — METHYLPREDNISOLONE 4 MG PO KIT: PACK | ORAL | Status: DC

## 2012-12-07 NOTE — Progress Notes (Signed)
[facility]:  Office Visit  Chief Complaint:  Chief Complaint  Patient presents with  . Hand Pain    follow up pain in both hands    HPI: Lori Shepherd is a 42 y.o. female who complains of  Bilateral wrist pain along radial aspect. She was dx with  Dequervains tenosynovitis on 11/03/12 about 1 month ago. She has been wearing her thumb spica splints and taking naproxen intermittently without relief. She is a Runner, broadcasting/film/video. She has a 20-12 month old but is not able to hold him much since her thumbs starts hurting. She now has numbness and tingling along the lateral dorsal aspect of her hands along the 5th and 4th finger bilaterally. She denies doing a lot of repetitive motion. She is right handed. The thumb and Dequervain sxs started about 5 months ago but she was only evaluated for it 1 month ago. She has zero to minimal weakness depending on what she is doing--her issue is more with pain when she is holding or lifting things in certain positions and now she has numbness and tingling along the lateral portion of her hands. Denies any neck or shoulder problems/injuries.   Past Medical History  Diagnosis Date  . Artificial insemination   . AMA (advanced maternal age) multigravida 35+   . Abnormal Pap smear   . Condyloma   . Female infertility associated with anovulation   . Postpartum care following cesarean delivery (05/11/12) 05/12/2012   Past Surgical History  Procedure Laterality Date  . Hysteroscopy    . Cesarean section  05/11/2012    Procedure: CESAREAN SECTION;  Surgeon: Lenoard Aden, MD;  Location: [facility];  Service: Obstetrics;  Laterality: N/A;   History   Social History  . Marital Status: Divorced    Spouse Name: N/A    Number of Children: N/A  . Years of Education: N/A   Social History Main Topics  . Smoking status: Never Smoker   . Smokeless tobacco: Never Used  . Alcohol Use: No  . Drug Use: No  . Sexually Active: Yes   Other Topics Concern   . None   Social History Narrative  . None   Family History  Problem Relation Age of Onset  . Hypertension Mother   . Migraines Mother   . Cancer Mother     melanoma  . Rheum arthritis Father   . Urolithiasis Father   . Diabetes Brother   . Cancer Paternal Grandmother     lung  . Cancer Paternal Grandfather     lung  . Heart attack Paternal Grandfather    No Known Allergies Prior to Admission medications   Medication Sig Start Date End Date Taking? Authorizing Provider  Multiple Vitamin (MULTIVITAMIN) tablet Take 1 tablet by mouth daily.    Historical Provider, MD  naproxen (NAPROSYN) 500 MG tablet Take 1 tablet (500 mg total) by mouth 2 (two) times daily with a meal. X10days then prn 10/30/12   Anders Simmonds, PA-C     ROS: The patient denies fevers, chills, night sweats, unintentional weight loss, chest pain, palpitations, wheezing, dyspnea on exertion, nausea, vomiting, abdominal pain, dysuria, hematuria, melena; +numbness, weakness, or tingling.   All other systems have been reviewed and were otherwise negative with the exception of those mentioned in the HPI and as above.    PHYSICAL EXAM: Filed Vitals:   12/07/12 1549  BP: 98/68  Pulse: 63  Temp: 98.3 F (36.8 C)  Resp: 16  Filed Vitals:   12/07/12 1549  Height: 5\' 4"  (1.626 m)  Weight: 127 lb (57.607 kg)   Body mass index is 21.79 kg/(m^2).  General: Alert, no acute distress HEENT:  Normocephalic, atraumatic, oropharynx patent.  Cardiovascular:  Regular rate and rhythm, no rubs murmurs or gallops.  No Carotid bruits, radial pulse intact. No pedal edema.  Respiratory: Clear to auscultation bilaterally.  No wheezes, rales, or rhonchi.  No cyanosis, no use of accessory musculature GI: No organomegaly, abdomen is soft and non-tender, positive bowel sounds.  No masses. Skin: No rashes. Neurologic: Facial musculature symmetric. Psychiatric: Patient is appropriate throughout our interaction. Lymphatic: No  cervical lymphadenopathy Musculoskeletal: Gait intact. Neck-normal exam, negative Spurling  Shoulders-normal exam, negative impingement Elbows-normal exam, neg Tinels Wrist-no deformities, no thenar or hypothenar wasting, full ROM , 5/5 strength, sensation intact, neg Tinels, neg Phalens, + Dequervains bilaterally + radial pulses   LABS: Results for orders placed during the hospital encounter of 05/11/12  CBC      Result Value Range   WBC 11.0 (*) 4.0 - 10.5 K/uL   RBC 3.97  3.87 - 5.11 MIL/uL   Hemoglobin 12.0  12.0 - 15.0 g/dL   HCT 95.6 (*) 21.3 - 08.6 %   MCV 90.2  78.0 - 100.0 fL   MCH 30.2  26.0 - 34.0 pg   MCHC 33.5  30.0 - 36.0 g/dL   RDW 57.8  46.9 - 62.9 %   Platelets 150  150 - 400 K/uL  RPR      Result Value Range   RPR NON REACTIVE  NON REACTIVE  CBC      Result Value Range   WBC 16.2 (*) 4.0 - 10.5 K/uL   RBC 3.15 (*) 3.87 - 5.11 MIL/uL   Hemoglobin 9.8 (*) 12.0 - 15.0 g/dL   HCT 52.8 (*) 41.3 - 24.4 %   MCV 90.5  78.0 - 100.0 fL   MCH 31.1  26.0 - 34.0 pg   MCHC 34.4  30.0 - 36.0 g/dL   RDW 01.0  27.2 - 53.6 %   Platelets 142 (*) 150 - 400 K/uL  ABO/RH      Result Value Range   ABO/RH(D) O POS       EKG/XRAY:   Primary read interpreted by Dr. Conley Rolls at [facility]. No fractures or dislocation   ASSESSMENT/PLAN: Encounter Diagnoses  Name Primary?  . Tendonitis of wrist, left Yes  . Tendonitis of wrist, right   . Wrist pain, unspecified laterality    Rx Medrol dose pack Rx Tramadol Refer to ortho--[facility] Rheumatoid arthritis factor because father has RA Gross sideeffects, risk and benefits, and alternatives of medications d/w patient. Patient is aware that all medications have potential sideeffects and we are unable to predict every sideeffect or drug-drug interaction that may occur. F/u prn   Taimur Fier PHUONG, DO 12/07/2012 5:11 PM

## 2012-12-09 LAB — RHEUMATOID FACTOR: Rheumatoid fact SerPl-aCnc: <10 [IU]/mL (ref <=14)

## 2012-12-19 ENCOUNTER — Other Ambulatory Visit (HOSPITAL_COMMUNITY): Payer: Self-pay | Admitting: Obstetrics & Gynecology

## 2012-12-19 DIAGNOSIS — Z1231 Encounter for screening mammogram for malignant neoplasm of breast: Secondary | ICD-10-CM

## 2013-01-09 ENCOUNTER — Ambulatory Visit (HOSPITAL_COMMUNITY)
Admission: RE | Admit: 2013-01-09 | Discharge: 2013-01-09 | Disposition: A | Discharge status: Home / Self Care | Payer: BC Managed Care – PPO | Source: Ambulatory Visit | Attending: Obstetrics & Gynecology | Admitting: Obstetrics & Gynecology

## 2013-01-09 ENCOUNTER — Other Ambulatory Visit: Payer: Self-pay | Admitting: Surgery

## 2013-01-09 DIAGNOSIS — Z1231 Encounter for screening mammogram for malignant neoplasm of breast: Secondary | ICD-10-CM

## 2013-01-23 ENCOUNTER — Ambulatory Visit (HOSPITAL_BASED_OUTPATIENT_CLINIC_OR_DEPARTMENT_OTHER): Admit: 2013-01-23 | Payer: Self-pay | Admitting: Orthopedic Surgery

## 2013-01-23 ENCOUNTER — Encounter (HOSPITAL_BASED_OUTPATIENT_CLINIC_OR_DEPARTMENT_OTHER): Payer: Self-pay

## 2013-01-23 SURGERY — RELEASE, FIRST DORSAL COMPARTMENT, HAND
Anesthesia: General | Laterality: Bilateral

## 2013-01-30 ENCOUNTER — Encounter: Payer: Self-pay | Admitting: *Deleted

## 2013-01-30 DIAGNOSIS — M659 Synovitis and tenosynovitis, unspecified: Secondary | ICD-10-CM | POA: Insufficient documentation

## 2013-10-03 ENCOUNTER — Other Ambulatory Visit (HOSPITAL_COMMUNITY): Payer: Self-pay | Admitting: Obstetrics & Gynecology

## 2013-10-03 DIAGNOSIS — Z1231 Encounter for screening mammogram for malignant neoplasm of breast: Secondary | ICD-10-CM

## 2013-10-09 ENCOUNTER — Ambulatory Visit (HOSPITAL_COMMUNITY)
Admission: RE | Admit: 2013-10-09 | Discharge: 2013-10-09 | Disposition: A | Discharge status: Home / Self Care | Payer: BC Managed Care – PPO | Source: Ambulatory Visit | Attending: Obstetrics & Gynecology | Admitting: Obstetrics & Gynecology

## 2013-10-09 DIAGNOSIS — Z1231 Encounter for screening mammogram for malignant neoplasm of breast: Secondary | ICD-10-CM | POA: Insufficient documentation

## 2014-01-22 ENCOUNTER — Other Ambulatory Visit: Payer: Self-pay | Admitting: Physician Assistant

## 2014-06-24 ENCOUNTER — Ambulatory Visit (INDEPENDENT_AMBULATORY_CARE_PROVIDER_SITE_OTHER): Payer: BC Managed Care – PPO

## 2014-06-24 ENCOUNTER — Ambulatory Visit (INDEPENDENT_AMBULATORY_CARE_PROVIDER_SITE_OTHER): Payer: BC Managed Care – PPO | Admitting: Emergency Medicine

## 2014-06-24 VITALS — BP 122/78 | HR 70 | Temp 98.1°F | Resp 18 | Ht 66.0 in | Wt 129.2 lb

## 2014-06-24 DIAGNOSIS — R0981 Nasal congestion: Secondary | ICD-10-CM

## 2014-06-24 DIAGNOSIS — J029 Acute pharyngitis, unspecified: Secondary | ICD-10-CM

## 2014-06-24 DIAGNOSIS — M25532 Pain in left wrist: Secondary | ICD-10-CM

## 2014-06-24 LAB — POCT RAPID STREP A (OFFICE): Rapid Strep A Screen: NEGATIVE

## 2014-06-24 MED ORDER — GUAIFENESIN ER 1200 MG PO TB12: 1.0000 | ORAL_TABLET | Freq: Two times a day (BID) | ORAL | Status: DC | PRN

## 2014-06-24 MED ORDER — AMOXICILLIN 875 MG PO TABS: 875.0000 mg | ORAL_TABLET | Freq: Two times a day (BID) | ORAL | Status: AC

## 2014-06-24 MED ORDER — IPRATROPIUM BROMIDE 0.03 % NA SOLN: 2.0000 | Freq: Two times a day (BID) | NASAL | Status: DC

## 2014-06-24 MED ORDER — MELOXICAM 15 MG PO TABS: 15.0000 mg | ORAL_TABLET | Freq: Every day | ORAL | Status: DC

## 2014-06-24 NOTE — Patient Instructions (Signed)
May fill amoxicillin in 3-4 days if not getting better. Return if not better in 7-10 days. You will get a call to make appt with hand surgery Take mobic one time daily.

## 2014-06-24 NOTE — Progress Notes (Signed)
Subjective:    Patient ID: Lori Shepherd, female    DOB: 02/12/1971, 43 y.o.   MRN: [id number]  HPI  This is a 43 year old female presenting with sore throat x 1 week and left wrist pain x 1 month.  Wrist pain: She is having ulnar sided wrist pain for 1 month. She is unable to fully extend her 5th digit. She reports she had bilateral de quervains a year ago and was seen by hand surgery. They suggested surgery however she opted for conservative therapy. She improved with splints and anti-inflammatory meds. She reports her pain today feels similar to [place] but is in different location. She has been wearing a splint and taking aleve with no relief. She denies paresthesias.  Sore throat: She has had a right sided sore throat for 1 week. She has also had nasal congestion and developed sinus pressure 4 days ago. She has tried throat spray, lozenges and claritin - no relief. She denies otalgia, cough, fever or chills. She has a 43 year old in daycare. She reports she gets sinus infections yearly - she states she feels "these are the early signs". She is not a smoker and does not have a history of environmental allergies.  Review of Systems  Constitutional: Negative for fever and chills.  HENT: Positive for congestion, sinus pressure and sore throat. Negative for ear pain.   Eyes: Negative for redness.  Respiratory: Negative for cough.   Gastrointestinal: Negative for nausea, vomiting, abdominal pain and diarrhea.  Musculoskeletal: Positive for arthralgias. Negative for joint swelling.  Skin: Negative for color change, rash and wound.  Allergic/Immunologic: Negative for environmental allergies.  Neurological: Positive for weakness (5th digit) and headaches. Negative for numbness.   Patient Active Problem List   Diagnosis Date Noted  . 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 Postpartum care following cesarean delivery (05/11/12)  05/12/2012   Prior to Admission medications   Medication Sig Start Date End Date Taking? Authorizing Provider  Multiple Vitamin (MULTIVITAMIN) tablet Take 1 tablet by mouth daily.   Yes Historical Provider, MD   No Known Allergies   Patient's social and family history were reviewed.     Objective:   Physical Exam  Constitutional: She is oriented to person, place, and time. She appears well-developed and well-nourished. No distress.  HENT:  Head: Normocephalic and atraumatic.  Right Ear: Hearing, tympanic membrane, external ear and ear canal normal.  Left Ear: Hearing, tympanic membrane, external ear and ear canal normal.  Nose: Nose normal.  Mouth/Throat: Uvula is midline and mucous membranes are normal. Posterior oropharyngeal erythema present. No oropharyngeal exudate, posterior oropharyngeal edema or tonsillar abscesses.  Eyes: Conjunctivae and lids are normal. Right eye exhibits no discharge. Left eye exhibits no discharge. No scleral icterus.  Cardiovascular: Normal rate, regular rhythm, normal heart sounds, intact distal pulses and normal pulses.   No murmur heard. Pulmonary/Chest: Effort normal and breath sounds normal. No respiratory distress. She has no wheezes. She has no rhonchi. She has no rales.  Musculoskeletal:       Left wrist: She exhibits tenderness (ulnar side dorsal wrist). She exhibits normal range of motion, no swelling and no effusion.       Left hand: She exhibits decreased range of motion (unable to fully extend 5th digit). She exhibits no tenderness, no bony tenderness and normal capillary refill. Normal sensation noted. Decreased strength (extension of 5th digit) noted.  Neurological: She is alert and  oriented to person, place, and time. No sensory deficit.  Skin: Skin is warm, dry and intact. No lesion and no rash noted.  Psychiatric: She has a normal mood and affect. Her speech is normal and behavior is normal. Thought content normal.   Results for orders  placed or performed in visit on 06/24/14  POCT rapid strep A  Result Value Ref Range   Rapid Strep A Screen Negative Negative   [facility] reading (PRIMARY) by  Dr. Everlene Farrier: negative for acute bony abnormality.     Assessment & Plan:  1. Sore throat 2. Nasal congestion This is likely viral. Rapid strep negative, culture pending. Focus is on supportive care, see medications prescribed below. Pt given print out prescription of amoxicillin to use in 3-4 days if symptoms are not improving since she reports she is prone to sinusitis. She will return in 10-14 days if not improving.  - POCT rapid strep A - Throat culture ([facility]) - ipratropium (ATROVENT) 0.03 % nasal spray; Place 2 sprays into both nostrils 2 (two) times daily.  Dispense: 30 mL; Refill: 0 - Guaifenesin (MUCINEX MAXIMUM STRENGTH) 1200 MG TB12; Take 1 tablet (1,200 mg total) by mouth every 12 (twelve) hours as needed.  Dispense: 14 tablet; Refill: 1 - amoxicillin (AMOXIL) 875 MG tablet; Take 1 tablet (875 mg total) by mouth 2 (two) times daily.  Dispense: 20 tablet; Refill: 0  3. Left wrist pain Pt. Likely with tendon inflammation/injury. Radiograph negative. Sensation is intact. Fit pt for ulnar gutter splint and gave mobic. Due to weakness, will refer to hand surgery. - DG Wrist Complete Left; Future - meloxicam (MOBIC) 15 MG tablet; Take 1 tablet (15 mg total) by mouth daily.  Dispense: 15 tablet; Refill: 0 - Ambulatory referral to Orthopedic Surgery   Benjaman Pott. Drenda Freeze, MHS [facility]  06/24/2014

## 2014-06-26 LAB — CULTURE, GROUP A STREP: ORGANISM ID, BACTERIA: NORMAL

## 2015-10-19 ENCOUNTER — Ambulatory Visit (HOSPITAL_COMMUNITY): Payer: Self-pay

## 2015-10-19 ENCOUNTER — Ambulatory Visit: Payer: BC Managed Care – PPO

## 2015-10-19 ENCOUNTER — Ambulatory Visit (INDEPENDENT_AMBULATORY_CARE_PROVIDER_SITE_OTHER): Payer: BC Managed Care – PPO

## 2015-10-19 ENCOUNTER — Ambulatory Visit (INDEPENDENT_AMBULATORY_CARE_PROVIDER_SITE_OTHER): Payer: BC Managed Care – PPO | Admitting: Physician Assistant

## 2015-10-19 VITALS — BP 116/62 | HR 79 | Temp 98.4°F | Resp 16 | Ht 63.0 in | Wt 131.8 lb

## 2015-10-19 DIAGNOSIS — J9801 Acute bronchospasm: Secondary | ICD-10-CM

## 2015-10-19 DIAGNOSIS — M25532 Pain in left wrist: Secondary | ICD-10-CM

## 2015-10-19 DIAGNOSIS — R0781 Pleurodynia: Secondary | ICD-10-CM

## 2015-10-19 DIAGNOSIS — R059 Cough, unspecified: Secondary | ICD-10-CM

## 2015-10-19 DIAGNOSIS — R05 Cough: Secondary | ICD-10-CM

## 2015-10-19 MED ORDER — BENZONATATE 100 MG PO CAPS: 100.0000 mg | ORAL_CAPSULE | Freq: Three times a day (TID) | ORAL | Status: DC | PRN

## 2015-10-19 MED ORDER — AZITHROMYCIN 250 MG PO TABS: ORAL_TABLET | ORAL | Status: DC

## 2015-10-19 MED ORDER — MELOXICAM 15 MG PO TABS: 15.0000 mg | ORAL_TABLET | Freq: Every day | ORAL | Status: DC

## 2015-10-19 MED ORDER — PREDNISONE 20 MG PO TABS: ORAL_TABLET | ORAL | Status: DC

## 2015-10-19 NOTE — Patient Instructions (Addendum)
IF you received an x-ray today, you will receive an invoice from [facility]. Please contact [facility] at [phone] with questions or concerns regarding your invoice.   IF you received labwork today, you will receive an invoice from [employer]. Please contact [facility] at [phone] with questions or concerns regarding your invoice.   Our billing staff will not be able to assist you with questions regarding bills from these companies.  You will be contacted with the lab results as soon as they are available. The fastest way to get your results is to activate your My Chart account. Instructions are located on the last page of this paperwork. If you have not heard from [place] regarding the results in 2 weeks, please contact this office.    Please hydrate well with 64 oz of water per day. You can use ice in this area 15 minutes, 3 times per day.   Please take the prednisone as prescribed.       Rib Contusion A rib contusion is a deep bruise on your rib area. Contusions are the result of a blunt trauma that causes bleeding and injury to the tissues under the skin. A rib contusion may involve bruising of the ribs and of the skin and muscles in the area. The skin overlying the contusion may turn blue, purple, or yellow. Minor injuries will give you a painless contusion, but more severe contusions may stay painful and swollen for a few weeks. CAUSES  A contusion is usually caused by a blow, trauma, or direct force to an area of the body. This often occurs while playing contact sports. SYMPTOMS  Swelling and redness of the injured area.  Discoloration of the injured area.  Tenderness and soreness of the injured area.  Pain with or without movement. DIAGNOSIS  The diagnosis can be made by taking a medical history and performing a physical exam. An X-ray, CT scan, or MRI may be needed to determine if there were any associated injuries, such as  broken bones (fractures) or internal injuries. TREATMENT  Often, the best treatment for a rib contusion is rest. Icing or applying cold compresses to the injured area may help reduce swelling and inflammation. Deep breathing exercises may be recommended to reduce the risk of partial lung collapse and pneumonia. Over-the-counter or prescription medicines may also be recommended for pain control. HOME CARE INSTRUCTIONS   Apply ice to the injured area:  Put ice in a plastic bag.  Place a towel between your skin and the bag.  Leave the ice on for 20 minutes, 2-3 times per day.  Take medicines only as directed by your health care provider.  Rest the injured area. Avoid strenuous activity and any activities or movements that cause pain. Be careful during activities and avoid bumping the injured area.  Perform deep-breathing exercises as directed by your health care provider.  Do not lift anything that is heavier than 5 lb (2.3 kg) until your health care provider approves.  Do not use any tobacco products, including cigarettes, chewing tobacco, or electronic cigarettes. If you need help quitting, ask your health care provider. SEEK MEDICAL CARE IF:   You have increased bruising or swelling.  You have pain that is not controlled with treatment.  You have a fever. SEEK IMMEDIATE MEDICAL CARE IF:   You have difficulty breathing or shortness of breath.  You develop a continual cough, or you cough up thick or bloody sputum.  You feel sick  to your stomach (nauseous), you throw up (vomit), or you have abdominal pain.   This information is not intended to replace advice given to you by your health care provider. Make sure you discuss any questions you have with your health care provider.   Document Released: 03/30/2001 Document Revised: 07/26/2014 Document Reviewed: 04/16/2014 Elsevier Interactive Patient Education [employer].

## 2015-10-19 NOTE — Progress Notes (Signed)
[facility] [address] [place] [address] [phone]  Date:  10/19/2015   Name:  DANDRE BARINGER   DOB:  1971/06/23   MRN:  [id number]  PCP:  Elveria Royals, MD   Chief Complaint  Patient presents with  . Cough    x 3wks  . Back Pain    pt states hurt pain starts under her ribs and goes around her back/ x 3wks     History of Present Illness:  LENAYAH LANTZER is a 45 y.o. female patient who presents to [facility] for cc of back pain.  She developed a cough more than 4 weeks.  She had noticed 3 weeks ago, that she had progressively worsening cough and felt a sharp pain.  She has been soaking and holding side, and taking motrin.  It waxes and wanes.  When she lays down at night, it feels like someone is stabbing her in the back.  Pain with sneezing, coughing or deep inspiration.  The cough is productive of green mucus.  She had taken some amoxicillin of 10 days for what appeared like a sinus infection initially 4 weeks ago.     Patient Active Problem List   Diagnosis Date Noted  . 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 Postpartum care following cesarean delivery (05/11/12) 05/12/2012    Past Medical History  Diagnosis Date  . Artificial insemination   . AMA (advanced maternal age) multigravida 4+   . Abnormal Pap smear   . Condyloma   . Female infertility associated with anovulation   . Postpartum care following cesarean delivery (05/11/12) 05/12/2012    Past Surgical History  Procedure Laterality Date  . Hysteroscopy    . Cesarean section  05/11/2012    Procedure: CESAREAN SECTION;  Surgeon: Lovenia Kim, MD;  Location: [facility];  Service: Obstetrics;  Laterality: N/A;    Social History  Substance Use Topics  . Smoking status: Never Smoker   . Smokeless tobacco: Never Used  . Alcohol Use: No    Family History  Problem Relation Age of Onset  . Hypertension Mother   . Migraines  Mother   . Cancer Mother     melanoma  . Rheum arthritis Father   . Urolithiasis Father   . Diabetes Brother   . Cancer Paternal Grandmother     lung  . Cancer Paternal Grandfather     lung  . Heart attack Paternal Grandfather     No Known Allergies  Medication list has been reviewed and updated.  Current Outpatient Prescriptions on File Prior to Visit  Medication Sig Dispense Refill  . Multiple Vitamin (MULTIVITAMIN) tablet Take 1 tablet by mouth daily.    . Guaifenesin (MUCINEX MAXIMUM STRENGTH) 1200 MG TB12 Take 1 tablet (1,200 mg total) by mouth every 12 (twelve) hours as needed. (Patient not taking: Reported on 10/19/2015) 14 tablet 1  . ipratropium (ATROVENT) 0.03 % nasal spray Place 2 sprays into both nostrils 2 (two) times daily. (Patient not taking: Reported on 10/19/2015) 30 mL 0  . meloxicam (MOBIC) 15 MG tablet Take 1 tablet (15 mg total) by mouth daily. (Patient not taking: Reported on 10/19/2015) 15 tablet 0   No current facility-administered medications on file prior to visit.    ROS ROS otherwise unremarkable unless listed above.   Physical Examination: BP 116/62 mmHg  Pulse 79  Temp(Src) 98.4 F (36.9 C) (Oral)  Resp 16  Ht  5\' 3"  (1.6 m)  Wt 131 lb 12.8 oz (59.784 kg)  BMI 23.35 kg/m2  SpO2 98%  LMP 10/18/2015 Ideal Body Weight: Weight in (lb) to have BMI = 25: 140.8  Physical Exam  Constitutional: She is oriented to person, place, and time. She appears well-developed and well-nourished. No distress.  HENT:  Head: Normocephalic and atraumatic.  Right Ear: Tympanic membrane, external ear and ear canal normal.  Left Ear: Tympanic membrane, external ear and ear canal normal.  Nose: Mucosal edema and rhinorrhea present. Right sinus exhibits no maxillary sinus tenderness and no frontal sinus tenderness. Left sinus exhibits no maxillary sinus tenderness and no frontal sinus tenderness.  Mouth/Throat: No uvula swelling. No oropharyngeal exudate, posterior  oropharyngeal edema or posterior oropharyngeal erythema.  Eyes: Conjunctivae and EOM are normal. Pupils are equal, round, and reactive to light.  Cardiovascular: Normal rate and regular rhythm.  Exam reveals no gallop, no distant heart sounds and no friction rub.   No murmur heard. Pulmonary/Chest: Effort normal. No respiratory distress. She has no decreased breath sounds. She has no wheezes. She has no rhonchi.  Lymphadenopathy:       Head (right side): No submandibular, no tonsillar, no preauricular and no posterior auricular adenopathy present.       Head (left side): No submandibular, no tonsillar, no preauricular and no posterior auricular adenopathy present.  Neurological: She is alert and oriented to person, place, and time.  Skin: She is not diaphoretic.  Psychiatric: She has a normal mood and affect. Her behavior is normal.     Assessment and Plan: NELTA DUNSFORD is a 45 y.o. female who is here today for cc of rib pain. We will start her on an antibiotic to treat possible infection.  Given a prednisone taper for 7 days and supportive tx for cough.  I advised her that she could use a rib brace if she likes.  This sometimes prevents good breathing.  She will rtc in 2 weeks if no improvement.   Cough - Plan: DG Ribs Unilateral W/Chest Right, azithromycin (ZITHROMAX) 250 MG tablet, predniSONE (DELTASONE) 20 MG tablet, benzonatate (TESSALON PERLES) 100 MG capsule, CANCELED: DG Ribs Unilateral W/Chest Right  Rib pain on right side - Plan: DG Ribs Unilateral W/Chest Right, meloxicam (MOBIC) 15 MG tablet  Left wrist pain  Bronchospasm - Plan: meloxicam (MOBIC) 15 MG tablet  Ivar Drape, PA-C [facility] 10/19/2015 8:47 AM

## 2015-11-03 ENCOUNTER — Telehealth: Payer: Self-pay

## 2015-11-03 DIAGNOSIS — R0781 Pleurodynia: Secondary | ICD-10-CM

## 2015-11-03 NOTE — Telephone Encounter (Signed)
Pt states she is still having pain with the strained muscle and stephanie english states she would call in a muscle relaxer  Best number [phone]

## 2015-11-04 MED ORDER — CYCLOBENZAPRINE HCL 5 MG PO TABS: 5.0000 mg | ORAL_TABLET | Freq: Three times a day (TID) | ORAL | Status: DC | PRN

## 2015-11-04 NOTE — Telephone Encounter (Signed)
Left message that I have sent the flexeril at her pharmacy.  Advised that she should return if she is coughing.  She should also return if the rib pain does not improve in 1 week.

## 2016-04-10 IMAGING — CR DG WRIST COMPLETE 3+V*L*
4 series · 4 of 4 positions shown · non-contrast
Comparison: 12/07/2012

CLINICAL DATA: Left wrist pain

EXAM:
LEFT WRIST - COMPLETE 3+ VIEW

[PA]
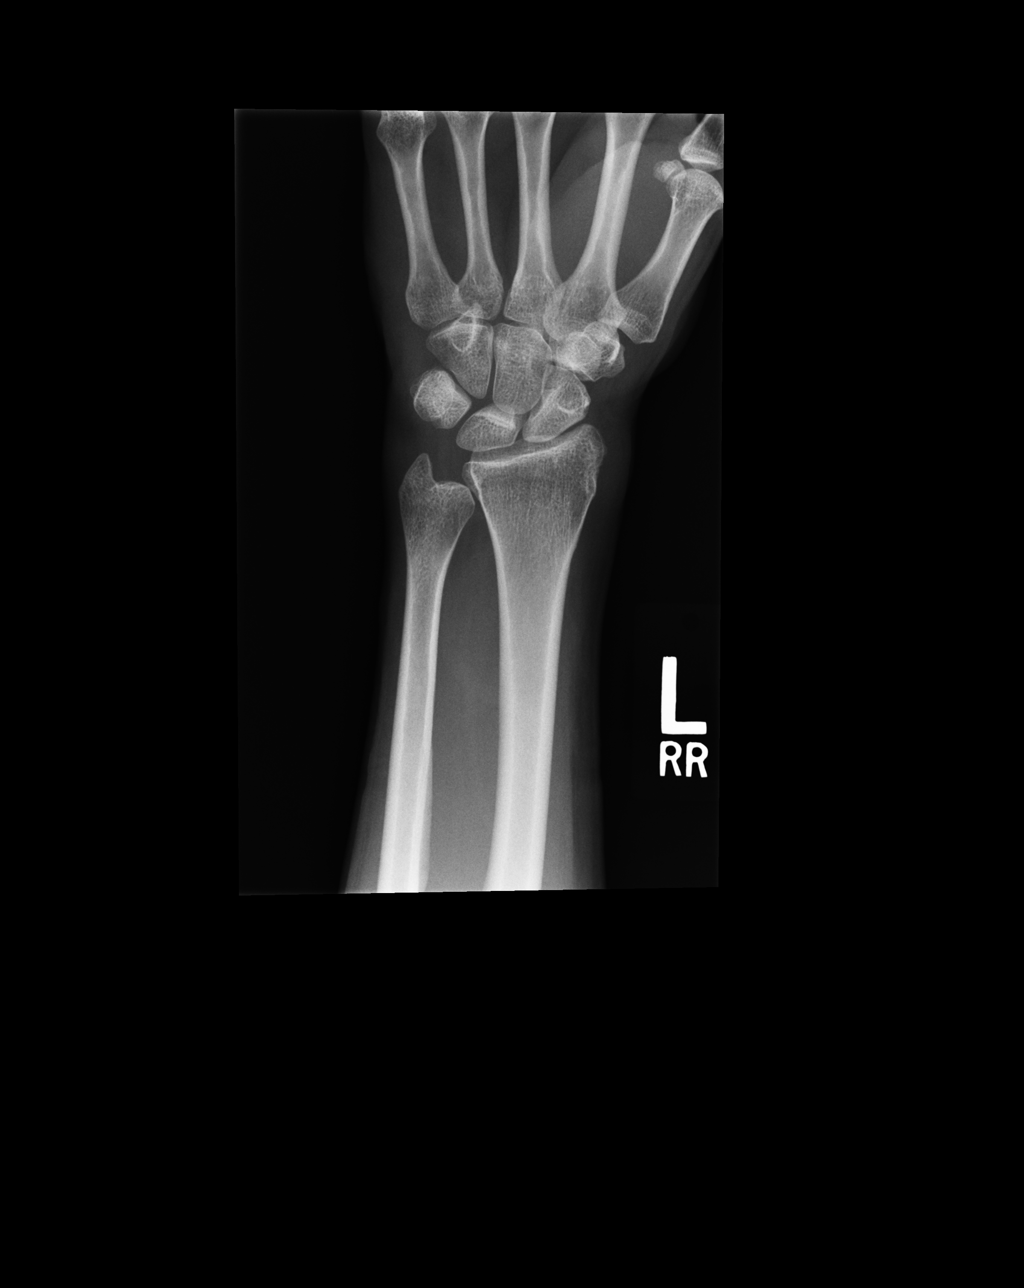

[pa int rot]
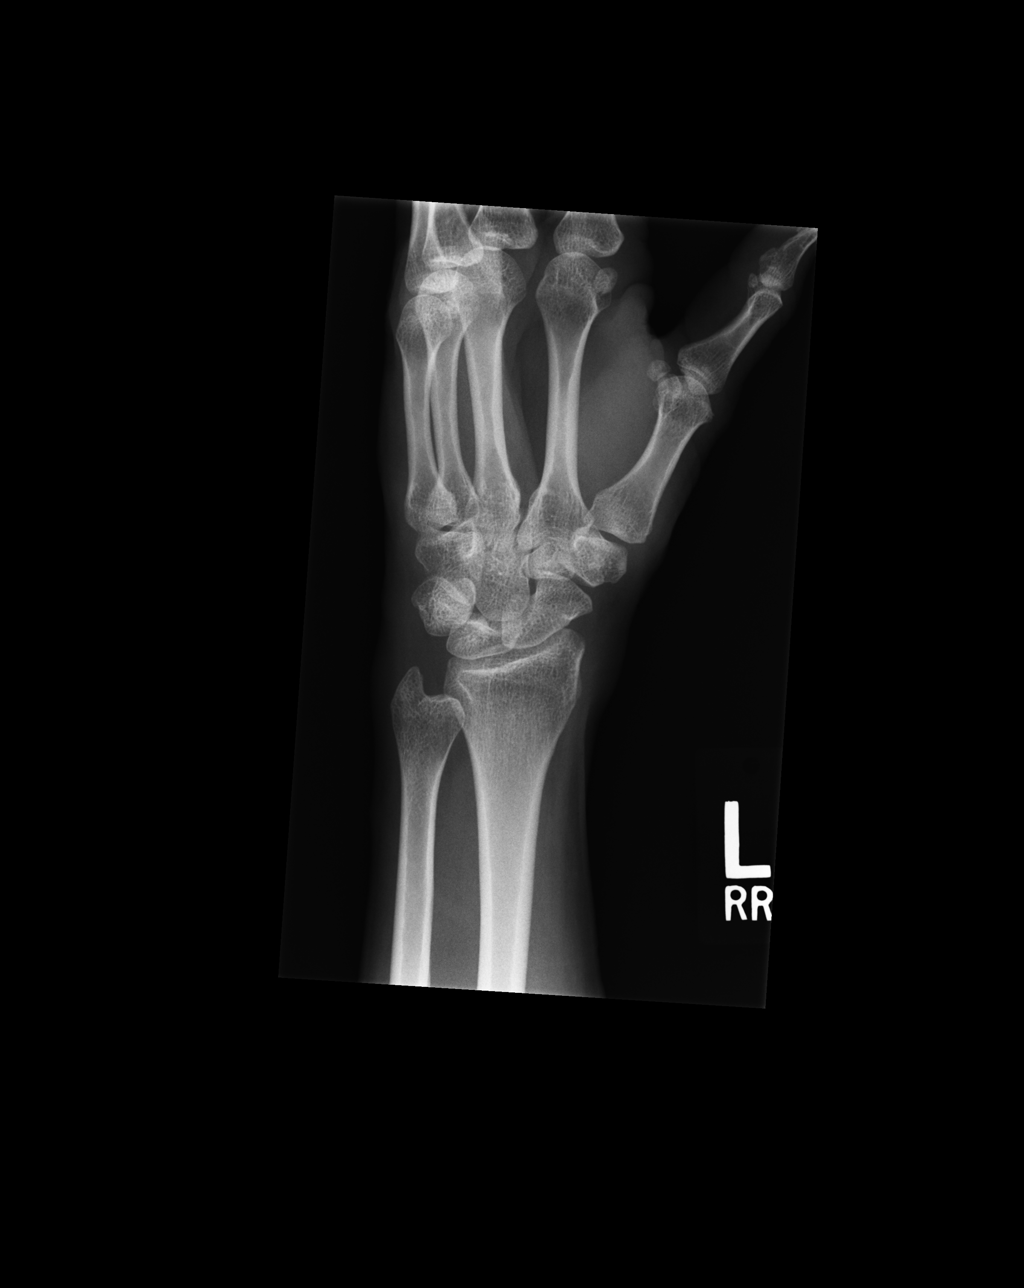

[lateral]
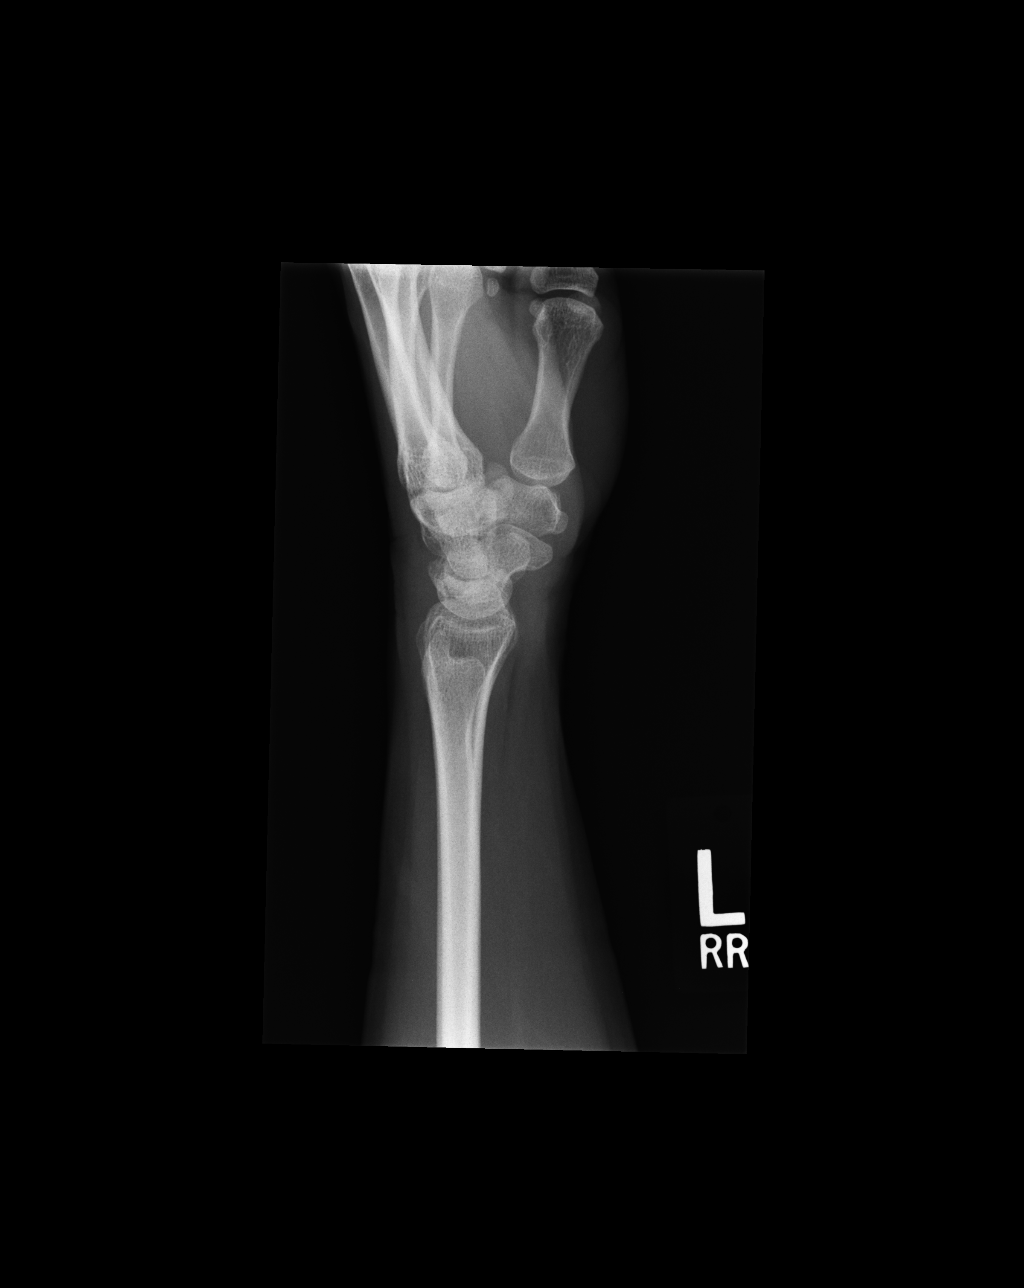

[ap ext rot]
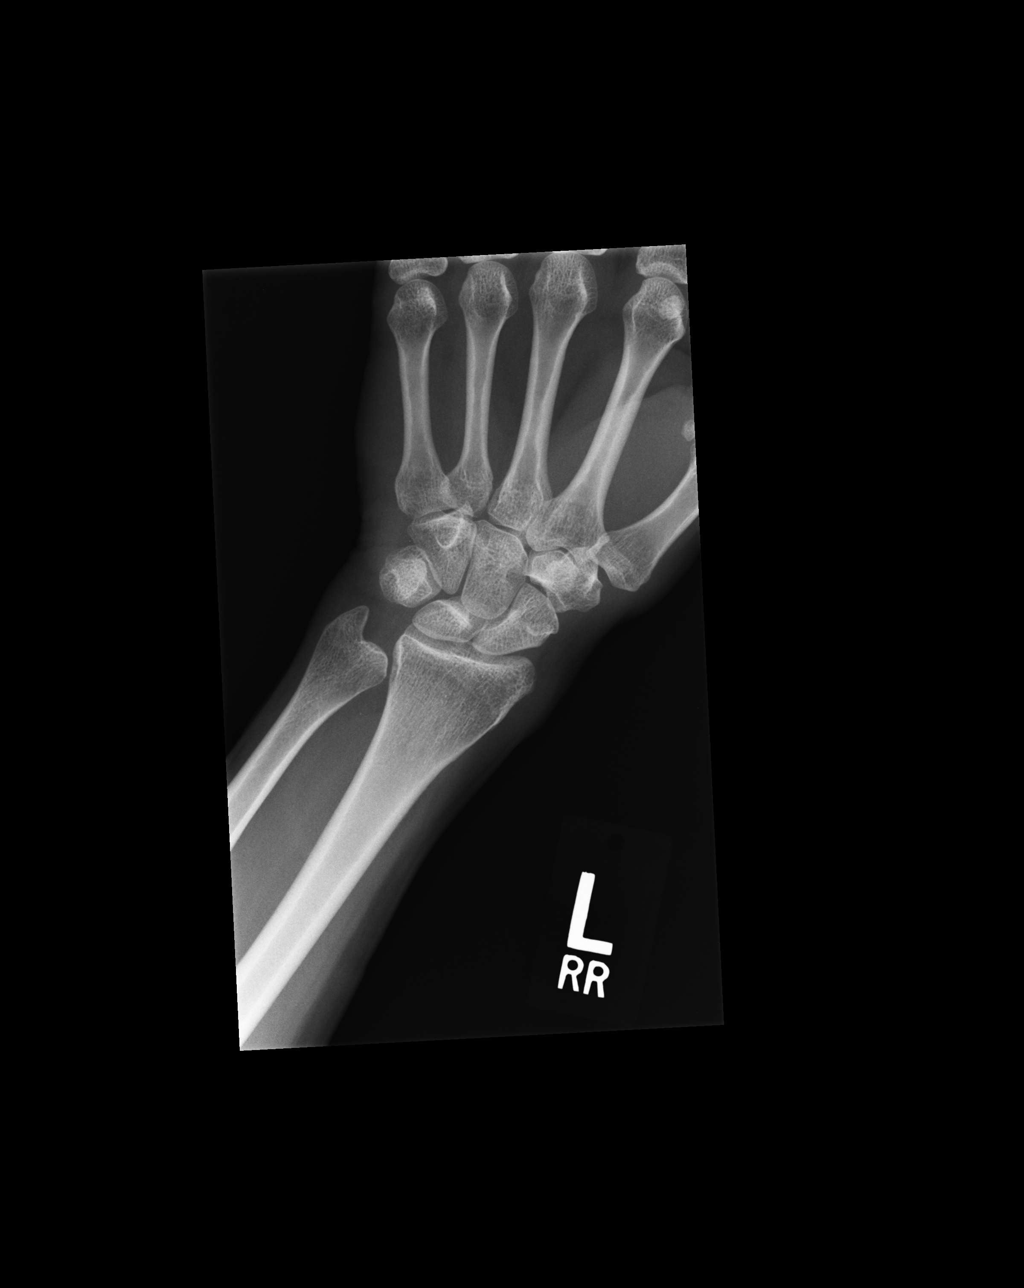

[4 of 4 positions shown; findings below may reference images not displayed]

FINDINGS: There is no evidence of fracture or dislocation. There is no
evidence of arthropathy or other focal bone abnormality. Soft
tissues are unremarkable.
IMPRESSION: Negative.

## 2017-04-13 ENCOUNTER — Ambulatory Visit (INDEPENDENT_AMBULATORY_CARE_PROVIDER_SITE_OTHER): Payer: BC Managed Care – PPO | Admitting: Physician Assistant

## 2017-04-13 ENCOUNTER — Encounter: Payer: Self-pay | Admitting: Physician Assistant

## 2017-04-13 VITALS — BP 114/72 | HR 89 | Temp 98.7°F | Resp 18 | Ht 65.0 in | Wt 132.6 lb

## 2017-04-13 DIAGNOSIS — Z111 Encounter for screening for respiratory tuberculosis: Secondary | ICD-10-CM

## 2017-04-13 DIAGNOSIS — Z13220 Encounter for screening for lipoid disorders: Secondary | ICD-10-CM | POA: Diagnosis not present

## 2017-04-13 DIAGNOSIS — Z0000 Encounter for general adult medical examination without abnormal findings: Secondary | ICD-10-CM | POA: Diagnosis not present

## 2017-04-13 DIAGNOSIS — Z1329 Encounter for screening for other suspected endocrine disorder: Secondary | ICD-10-CM | POA: Diagnosis not present

## 2017-04-13 DIAGNOSIS — Z130 Encounter for screening for diseases of the blood and blood-forming organs and certain disorders involving the immune mechanism: Secondary | ICD-10-CM

## 2017-04-13 DIAGNOSIS — Z23 Encounter for immunization: Secondary | ICD-10-CM

## 2017-04-13 LAB — CBC WITH DIFFERENTIAL/PLATELET
Basophils Absolute: 0 10*3/uL (ref 0.0–0.2)
Basos: 1 %
EOS (ABSOLUTE): 0.1 10*3/uL (ref 0.0–0.4)
Eos: 1 %
Hematocrit: 38.7 % (ref 34.0–46.6)
Hemoglobin: 12.1 g/dL (ref 11.1–15.9)
Immature Grans (Abs): 0 10*3/uL (ref 0.0–0.1)
Immature Granulocytes: 0 %
Lymphocytes Absolute: 1.5 10*3/uL (ref 0.7–3.1)
Lymphs: 22 %
MCH: 29.1 pg (ref 26.6–33.0)
MCHC: 31.3 g/dL — ABNORMAL LOW (ref 31.5–35.7)
MCV: 93 fL (ref 79–97)
Monocytes Absolute: 0.4 10*3/uL (ref 0.1–0.9)
Monocytes: 6 %
Neutrophils Absolute: 4.9 10*3/uL (ref 1.4–7.0)
Neutrophils: 70 %
Platelets: 254 10*3/uL (ref 150–379)
RBC: 4.16 x10E6/uL (ref 3.77–5.28)
RDW: 13.9 % (ref 12.3–15.4)
WBC: 6.8 10*3/uL (ref 3.4–10.8)

## 2017-04-13 LAB — CMP14+EGFR
ALT: 8 IU/L (ref 0–32)
AST: 16 IU/L (ref 0–40)
Albumin/Globulin Ratio: 1.7 (ref 1.2–2.2)
Albumin: 4.3 g/dL (ref 3.5–5.5)
Alkaline Phosphatase: 52 IU/L (ref 39–117)
BUN/Creatinine Ratio: 13 (ref 9–23)
BUN: 10 mg/dL (ref 6–24)
Bilirubin Total: 0.4 mg/dL (ref 0.0–1.2)
CO2: 20 mmol/L (ref 20–29)
Calcium: 8.8 mg/dL (ref 8.7–10.2)
Chloride: 105 mmol/L (ref 96–106)
Creatinine, Ser: 0.78 mg/dL (ref 0.57–1.00)
GFR calc Af Amer: 105 mL/min/{1.73_m2} (ref >59)
GFR calc non Af Amer: 91 mL/min/{1.73_m2} (ref >59)
Globulin, Total: 2.5 g/dL (ref 1.5–4.5)
Glucose: 82 mg/dL (ref 65–99)
Potassium: 4 mmol/L (ref 3.5–5.2)
Sodium: 139 mmol/L (ref 134–144)
Total Protein: 6.8 g/dL (ref 6.0–8.5)

## 2017-04-13 LAB — LIPID PANEL
Chol/HDL Ratio: 2.5 ratio (ref 0.0–4.4)
Cholesterol, Total: 147 mg/dL (ref 100–199)
HDL: 58 mg/dL (ref >39)
LDL Calculated: 77 mg/dL (ref 0–99)
Triglycerides: 61 mg/dL (ref 0–149)
VLDL Cholesterol Cal: 12 mg/dL (ref 5–40)

## 2017-04-13 NOTE — Progress Notes (Signed)

## 2017-04-13 NOTE — Progress Notes (Signed)
[facility] [place], [place] [address] [phone]  Date:  04/13/2017   Name:  Lori Shepherd   DOB:  25-Oct-1970   MRN:  [id number]  PCP:  Azucena Fallen, MD    Chief Complaint: Annual Exam (no pap and paperwork )   History of Present Illness:  This is a 46 y.o. female with PMH thyroid disease who is presenting for CPE. She is a Pharmacist, hospital in [employer]. Needs paperwork filled out. She is fasting today.   Complaints: needs TB and flu LMP: currently Contraception: none  Last pap: may 2018, negative Sexual history: none Immunizations: need tdap and flu shot Dentist: once a year Eye: checked regularly Diet/Exercise: walks, is active. Tries to eat healthy. Fruits, vegetables, meat, fried food.  Fam hx: Mother: melanoma, HTN, migraines; Father: rheum arthritis, urolithiasis; brother: DM; PGM lung cancer; PGF lung cancer and heart attack.  Tobacco/alcohol/substance use: never smoker, occasional alcohol, no drugs  MM: 11/2016 negative.    Review of Systems:  Review of Systems  Constitutional: Negative for chills, diaphoresis, fatigue and fever.  HENT: Negative for congestion, postnasal drip, rhinorrhea, sinus pressure, sneezing and sore throat.   Respiratory: Negative for cough, chest tightness, shortness of breath and wheezing.   Cardiovascular: Negative for chest pain and palpitations.  Gastrointestinal: Negative for abdominal pain, diarrhea, nausea and vomiting.  Genitourinary: Negative for decreased urine volume, difficulty urinating, dysuria, enuresis, flank pain, frequency, hematuria and urgency.  Musculoskeletal: Negative for back pain.  Neurological: Negative for dizziness, weakness, light-headedness and headaches.    Patient Active Problem List   Diagnosis Date Noted  . 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 Postpartum care following cesarean delivery (05/11/12) 05/12/2012    Prior  to Admission medications   Medication Sig Start Date End Date Taking? Authorizing Provider  levothyroxine (SYNTHROID, LEVOTHROID) 25 MCG tablet Take 25 mcg by mouth daily before breakfast.   Yes [provider]  benzonatate (TESSALON PERLES) 100 MG capsule Take 1-2 capsules (100-200 mg total) by mouth 3 (three) times daily as needed for cough. Patient not taking: Reported on 04/13/2017 10/19/15   Ivar Drape D, PA  cyclobenzaprine (FLEXERIL) 5 MG tablet Take 1-2 tablets (5-10 mg total) by mouth 3 (three) times daily as needed for muscle spasms. Patient not taking: Reported on 04/13/2017 11/04/15   Ivar Drape D, PA  Guaifenesin [facility] MAXIMUM STRENGTH) 1200 MG TB12 Take 1 tablet (1,200 mg total) by mouth every 12 (twelve) hours as needed. Patient not taking: Reported on 10/19/2015 06/24/14   Ezekiel Slocumb, PA-C  ipratropium (ATROVENT) 0.03 % nasal spray Place 2 sprays into both nostrils 2 (two) times daily. Patient not taking: Reported on 10/19/2015 06/24/14   Ezekiel Slocumb, PA-C  meloxicam (MOBIC) 15 MG tablet Take 1 tablet (15 mg total) by mouth daily. Patient not taking: Reported on 04/13/2017 10/19/15   Ivar Drape D, PA  Multiple Vitamin (MULTIVITAMIN) tablet Take 1 tablet by mouth daily.    [provider]    No Known Allergies  Past Surgical History:  Procedure Laterality Date  . CESAREAN SECTION  05/11/2012   Procedure: CESAREAN SECTION;  Surgeon: Lovenia Kim, MD;  Location: [facility];  Service: Obstetrics;  Laterality: N/A;  . HYSTEROSCOPY      Social History  Substance Use Topics  . Smoking status: Never Smoker  . Smokeless tobacco: Never Used  . Alcohol use No    Family History  Problem Relation Age of Onset  . Hypertension Mother   . Migraines Mother   . Cancer Mother        melanoma  . Rheum arthritis Father   . Urolithiasis Father   . Diabetes Brother   . Cancer Paternal Grandmother        lung  . Cancer Paternal Grandfather         lung  . Heart attack Paternal Grandfather     Medication list has been reviewed and updated.  Physical Examination:  Physical Exam  Constitutional: She is oriented to person, place, and time. She appears well-developed and well-nourished. No distress.  HENT:  Head: Normocephalic and atraumatic.  Mouth/Throat: Oropharynx is clear and moist.  Eyes: Pupils are equal, round, and reactive to light. Conjunctivae and EOM are normal.  Cardiovascular: Normal rate, regular rhythm and normal heart sounds.   No murmur heard. Pulmonary/Chest: Effort normal and breath sounds normal. She has no wheezes. Right breast exhibits no inverted nipple, no mass and no tenderness. Left breast exhibits no inverted nipple, no mass and no tenderness.  Musculoskeletal: Normal range of motion.  Neurological: She is alert and oriented to person, place, and time. She has normal reflexes.  Skin: Skin is warm and dry.  Psychiatric: She has a normal mood and affect. Her behavior is normal. Judgment and thought content normal.  Vitals reviewed.   BP 114/72   Pulse 89   Temp 98.7 F (37.1 C) (Oral)   Resp 18   Ht _0  (1.651 m)   Wt 132 lb 9.6 oz (60.1 kg)   LMP 04/13/2017 Comment: currently on   SpO2 98%   BMI 22.07 kg/m   Assessment and Plan: 1. Annual physical exam - Pt is a 46 year old female here for annual exam needing paperwork filled out for her job. Health maintenance information provided. Labs are pending, will contact with results. Flu, tdap and PPD done today. RTC in 48-72 hours for reading.   2. Need for influenza vaccination - Flu Vaccine QUAD 36+ mos IM  3. Need for Tdap vaccination - Tdap vaccine greater than or equal to 7yo IM  4. Screening-pulmonary TB - TB Skin Test  5. Screening, lipid - Lipid panel  6. Screening for endocrine disorder - CMP14+EGFR  7. Screening for deficiency anemia - CBC with Differential/Platelet   Mercer Pod, PA-C  [facility] 04/13/2017 8:17 AM

## 2017-04-13 NOTE — Patient Instructions (Addendum)
Return for your TB skin test reading within 48-72 hours.   Thank you for coming in today. I hope you feel we met your needs.  Feel free to call PCP if you have any questions or further requests.  Please consider signing up for MyChart if you do not already have it, as this is a great way to communicate with me.  Best,  Whitney McVey, PA-C   Health Maintenance, Female Adopting a healthy lifestyle and getting preventive care can go a long way to promote health and wellness. Talk with your health care provider about what schedule of regular examinations is right for you. This is a good chance for you to check in with your provider about disease prevention and staying healthy. In between checkups, there are plenty of things you can do on your own. Experts have done a lot of research about which lifestyle changes and preventive measures are most likely to keep you healthy. Ask your health care provider for more information. Weight and diet Eat a healthy diet  Be sure to include plenty of vegetables, fruits, low-fat dairy products, and lean protein.  Do not eat a lot of foods high in solid fats, added sugars, or salt.  Get regular exercise. This is one of the most important things you can do for your health. ? Most adults should exercise for at least 150 minutes each week. The exercise should increase your heart rate and make you sweat (moderate-intensity exercise). ? Most adults should also do strengthening exercises at least twice a week. This is in addition to the moderate-intensity exercise.  Maintain a healthy weight  Body mass index (BMI) is a measurement that can be used to identify possible weight problems. It estimates body fat based on height and weight. Your health care provider can help determine your BMI and help you achieve or maintain a healthy weight.  For females 69 years of age and older: ? A BMI below 18.5 is considered underweight. ? A BMI of 18.5 to 24.9 is normal. ? A  BMI of 25 to 29.9 is considered overweight. ? A BMI of 30 and above is considered obese.  Watch levels of cholesterol and blood lipids  You should start having your blood tested for lipids and cholesterol at 46 years of age, then have this test every 5 years.  You may need to have your cholesterol levels checked more often if: ? Your lipid or cholesterol levels are high. ? You are older than 46 years of age. ? You are at high risk for heart disease.  Cancer screening Lung Cancer  Lung cancer screening is recommended for adults 59-45 years old who are at high risk for lung cancer because of a history of smoking.  A yearly low-dose CT scan of the lungs is recommended for people who: ? Currently smoke. ? Have quit within the past 15 years. ? Have at least a 30-pack-year history of smoking. A pack year is smoking an average of one pack of cigarettes a day for 1 year.  Yearly screening should continue until it has been 15 years since you quit.  Yearly screening should stop if you develop a health problem that would prevent you from having lung cancer treatment.  Breast Cancer  Practice breast self-awareness. This means understanding how your breasts normally appear and feel.  It also means doing regular breast self-exams. Let your health care provider know about any changes, no matter how small.  If you are in your 28s  or 54s, you should have a clinical breast exam (CBE) by a health care provider every 1-3 years as part of a regular health exam.  If you are 3 or older, have a CBE every year. Also consider having a breast X-ray (mammogram) every year.  If you have a family history of breast cancer, talk to your health care provider about genetic screening.  If you are at high risk for breast cancer, talk to your health care provider about having an MRI and a mammogram every year.  Breast cancer gene (BRCA) assessment is recommended for women who have family members with  BRCA-related cancers. BRCA-related cancers include: ? Breast. ? Ovarian. ? Tubal. ? Peritoneal cancers.  Results of the assessment will determine the need for genetic counseling and BRCA1 and BRCA2 testing.  Cervical Cancer Your health care provider may recommend that you be screened regularly for cancer of the pelvic organs (ovaries, uterus, and vagina). This screening involves a pelvic examination, including checking for microscopic changes to the surface of your cervix (Pap test). You may be encouraged to have this screening done every 3 years, beginning at age 74.  For women ages 87-65, health care providers may recommend pelvic exams and Pap testing every 3 years, or they may recommend the Pap and pelvic exam, combined with testing for human papilloma virus (HPV), every 5 years. Some types of HPV increase your risk of cervical cancer. Testing for HPV may also be done on women of any age with unclear Pap test results.  Other health care providers may not recommend any screening for nonpregnant women who are considered low risk for pelvic cancer and who do not have symptoms. Ask your health care provider if a screening pelvic exam is right for you.  If you have had past treatment for cervical cancer or a condition that could lead to cancer, you need Pap tests and screening for cancer for at least 20 years after your treatment. If Pap tests have been discontinued, your risk factors (such as having a new sexual partner) need to be reassessed to determine if screening should resume. Some women have medical problems that increase the chance of getting cervical cancer. In these cases, your health care provider may recommend more frequent screening and Pap tests.  Colorectal Cancer  This type of cancer can be detected and often prevented.  Routine colorectal cancer screening usually begins at 46 years of age and continues through 46 years of age.  Your health care provider may recommend screening  at an earlier age if you have risk factors for colon cancer.  Your health care provider may also recommend using home test kits to check for hidden blood in the stool.  A small camera at the end of a tube can be used to examine your colon directly (sigmoidoscopy or colonoscopy). This is done to check for the earliest forms of colorectal cancer.  Routine screening usually begins at age 32.  Direct examination of the colon should be repeated every 5-10 years through 46 years of age. However, you may need to be screened more often if early forms of precancerous polyps or small growths are found.  Skin Cancer  Check your skin from head to toe regularly.  Tell your health care provider about any new moles or changes in moles, especially if there is a change in a mole's shape or color.  Also tell your health care provider if you have a mole that is larger than the size of a  pencil eraser.  Always use sunscreen. Apply sunscreen liberally and repeatedly throughout the day.  Protect yourself by wearing long sleeves, pants, a wide-brimmed hat, and sunglasses whenever you are outside.  Heart disease, diabetes, and high blood pressure  High blood pressure causes heart disease and increases the risk of stroke. High blood pressure is more likely to develop in: ? People who have blood pressure in the high end of the normal range (130-139/85-89 mm Hg). ? People who are overweight or obese. ? People who are African American.  If you are 74-60 years of age, have your blood pressure checked every 3-5 years. If you are 4 years of age or older, have your blood pressure checked every year. You should have your blood pressure measured twice-once when you are at a hospital or clinic, and once when you are not at a hospital or clinic. Record the average of the two measurements. To check your blood pressure when you are not at a hospital or clinic, you can use: ? An automated blood pressure machine at a  pharmacy. ? A home blood pressure monitor.  If you are between 33 years and 9 years old, ask your health care provider if you should take aspirin to prevent strokes.  Have regular diabetes screenings. This involves taking a blood sample to check your fasting blood sugar level. ? If you are at a normal weight and have a low risk for diabetes, have this test once every three years after 46 years of age. ? If you are overweight and have a high risk for diabetes, consider being tested at a younger age or more often. Preventing infection Hepatitis B  If you have a higher risk for hepatitis B, you should be screened for this virus. You are considered at high risk for hepatitis B if: ? You were born in a country where hepatitis B is common. Ask your health care provider which countries are considered high risk. ? Your parents were born in a high-risk country, and you have not been immunized against hepatitis B (hepatitis B vaccine). ? You have HIV or AIDS. ? You use needles to inject street drugs. ? You live with someone who has hepatitis B. ? You have had sex with someone who has hepatitis B. ? You get hemodialysis treatment. ? You take certain medicines for conditions, including cancer, organ transplantation, and autoimmune conditions.  Hepatitis C  Blood testing is recommended for: ? Everyone born from [birth year] through 1965. ? Anyone with known risk factors for hepatitis C.  Sexually transmitted infections (STIs)  You should be screened for sexually transmitted infections (STIs) including gonorrhea and chlamydia if: ? You are sexually active and are younger than 46 years of age. ? You are older than 46 years of age and your health care provider tells you that you are at risk for this type of infection. ? Your sexual activity has changed since you were last screened and you are at an increased risk for chlamydia or gonorrhea. Ask your health care provider if you are at risk.  If you do not  have HIV, but are at risk, it may be recommended that you take a prescription medicine daily to prevent HIV infection. This is called pre-exposure prophylaxis (PrEP). You are considered at risk if: ? You are sexually active and do not regularly use condoms or know the HIV status of your partner(s). ? You take drugs by injection. ? You are sexually active with a partner who has HIV.  Talk with your health care provider about whether you are at high risk of being infected with HIV. If you choose to begin PrEP, you should first be tested for HIV. You should then be tested every 3 months for as long as you are taking PrEP. Pregnancy  If you are premenopausal and you may become pregnant, ask your health care provider about preconception counseling.  If you may become pregnant, take 400 to 800 micrograms (mcg) of folic acid every day.  If you want to prevent pregnancy, talk to your health care provider about birth control (contraception). Osteoporosis and menopause  Osteoporosis is a disease in which the bones lose minerals and strength with aging. This can result in serious bone fractures. Your risk for osteoporosis can be identified using a bone density scan.  If you are 25 years of age or older, or if you are at risk for osteoporosis and fractures, ask your health care provider if you should be screened.  Ask your health care provider whether you should take a calcium or vitamin D supplement to lower your risk for osteoporosis.  Menopause may have certain physical symptoms and risks.  Hormone replacement therapy may reduce some of these symptoms and risks. Talk to your health care provider about whether hormone replacement therapy is right for you. Follow these instructions at home:  Schedule regular health, dental, and eye exams.  Stay current with your immunizations.  Do not use any tobacco products including cigarettes, chewing tobacco, or electronic cigarettes.  If you are pregnant,  do not drink alcohol.  If you are breastfeeding, limit how much and how often you drink alcohol.  Limit alcohol intake to no more than 1 drink per day for nonpregnant women. One drink equals 12 ounces of beer, 5 ounces of wine, or 1 ounces of hard liquor.  Do not use street drugs.  Do not share needles.  Ask your health care provider for help if you need support or information about quitting drugs.  Tell your health care provider if you often feel depressed.  Tell your health care provider if you have ever been abused or do not feel safe at home. This information is not intended to replace advice given to you by your health care provider. Make sure you discuss any questions you have with your health care provider. Document Released: 01/18/2011 Document Revised: 12/11/2015 Document Reviewed: 04/08/2015 Elsevier Interactive Patient Education  2018 [employer].  IF you received an x-ray today, you will receive an invoice from [facility]. Please contact [facility] at [phone] with questions or concerns regarding your invoice.   IF you received labwork today, you will receive an invoice from [facility]. Please contact [facility] at [phone] with questions or concerns regarding your invoice.   Our billing staff will not be able to assist you with questions regarding bills from these companies.  You will be contacted with the lab results as soon as they are available. The fastest way to get your results is to activate your My Chart account. Instructions are located on the last page of this paperwork. If you have not heard from [place] regarding the results in 2 weeks, please contact this office.

## 2017-04-14 ENCOUNTER — Encounter: Payer: Self-pay | Admitting: Physician Assistant

## 2017-04-14 NOTE — Progress Notes (Signed)
Labs wnl. Result letter sent in mail. RTC 1 year for repeat blood work

## 2017-04-15 ENCOUNTER — Ambulatory Visit (INDEPENDENT_AMBULATORY_CARE_PROVIDER_SITE_OTHER): Payer: BC Managed Care – PPO | Admitting: Physician Assistant

## 2017-04-15 DIAGNOSIS — Z111 Encounter for screening for respiratory tuberculosis: Secondary | ICD-10-CM

## 2017-04-15 LAB — TB SKIN TEST
Induration: 0 mm
TB Skin Test: NEGATIVE

## 2017-04-15 NOTE — Progress Notes (Signed)
Pt here to have TB skin test read. Neg results

## 2017-04-15 NOTE — Progress Notes (Signed)
Letter sent.

## 2017-04-22 ENCOUNTER — Ambulatory Visit: Payer: BC Managed Care – PPO | Admitting: Physician Assistant

## 2017-07-01 ENCOUNTER — Other Ambulatory Visit: Payer: Self-pay

## 2017-07-01 ENCOUNTER — Encounter: Payer: Self-pay | Admitting: Family Medicine

## 2017-07-01 ENCOUNTER — Ambulatory Visit: Payer: BC Managed Care – PPO | Admitting: Family Medicine

## 2017-07-01 VITALS — BP 110/62 | HR 100 | Temp 97.9°F | Resp 16 | Ht 66.14 in | Wt 132.0 lb

## 2017-07-01 DIAGNOSIS — J0101 Acute recurrent maxillary sinusitis: Secondary | ICD-10-CM

## 2017-07-01 MED ORDER — AMOXICILLIN 500 MG PO TABS: 1000.0000 mg | ORAL_TABLET | Freq: Two times a day (BID) | ORAL | 0 refills | Status: DC

## 2017-07-01 MED ORDER — IPRATROPIUM BROMIDE 0.03 % NA SOLN: 2.0000 | Freq: Two times a day (BID) | NASAL | 0 refills | Status: DC

## 2017-07-01 NOTE — Patient Instructions (Addendum)
Restart Sudafed for the next four days.   Sinusitis, Adult Sinusitis is soreness and inflammation of your sinuses. Sinuses are hollow spaces in the bones around your face. Your sinuses are located:  Around your eyes.  In the middle of your forehead.  Behind your nose.  In your cheekbones.  Your sinuses and nasal passages are lined with a stringy fluid (mucus). Mucus normally drains out of your sinuses. When your nasal tissues become inflamed or swollen, the mucus can become trapped or blocked so air cannot flow through your sinuses. This allows bacteria, viruses, and funguses to grow, which leads to infection. Sinusitis can develop quickly and last for 7?10 days (acute) or for more than 12 weeks (chronic). Sinusitis often develops after a cold. What are the causes? This condition is caused by anything that creates swelling in the sinuses or stops mucus from draining, including:  Allergies.  Asthma.  Bacterial or viral infection.  Abnormally shaped bones between the nasal passages.  Nasal growths that contain mucus (nasal polyps).  Narrow sinus openings.  Pollutants, such as chemicals or irritants in the air.  A foreign object stuck in the nose.  A fungal infection. This is rare.  What increases the risk? The following factors may make you more likely to develop this condition:  Having allergies or asthma.  Having had a recent cold or respiratory tract infection.  Having structural deformities or blockages in your nose or sinuses.  Having a weak immune system.  Doing a lot of swimming or diving.  Overusing nasal sprays.  Smoking.  What are the signs or symptoms? The main symptoms of this condition are pain and a feeling of pressure around the affected sinuses. Other symptoms include:  Upper toothache.  Earache.  Headache.  Bad breath.  Decreased sense of smell and taste.  A cough that may get worse at night.  Fatigue.  Fever.  Thick drainage  from your nose. The drainage is often green and it may contain pus (purulent).  Stuffy nose or congestion.  Postnasal drip. This is when extra mucus collects in the throat or back of the nose.  Swelling and warmth over the affected sinuses.  Sore throat.  Sensitivity to light.  How is this diagnosed? This condition is diagnosed based on symptoms, a medical history, and a physical exam. To find out if your condition is acute or chronic, your health care provider may:  Look in your nose for signs of nasal polyps.  Tap over the affected sinus to check for signs of infection.  View the inside of your sinuses using an imaging device that has a light attached (endoscope).  If your health care provider suspects that you have chronic sinusitis, you may also:  Be tested for allergies.  Have a sample of mucus taken from your nose (nasal culture) and checked for bacteria.  Have a mucus sample examined to see if your sinusitis is related to an allergy.  If your sinusitis does not respond to treatment and it lasts longer than 8 weeks, you may have an MRI or CT scan to check your sinuses. These scans also help to determine how severe your infection is. In rare cases, a bone biopsy may be done to rule out more serious types of fungal sinus disease. How is this treated? Treatment for sinusitis depends on the cause and whether your condition is chronic or acute. If a virus is causing your sinusitis, your symptoms will go away on their own within 10  days. You may be given medicines to relieve your symptoms, including:  Topical nasal decongestants. They shrink swollen nasal passages and let mucus drain from your sinuses.  Antihistamines. These drugs block inflammation that is triggered by allergies. This can help to ease swelling in your nose and sinuses.  Topical nasal corticosteroids. These are nasal sprays that ease inflammation and swelling in your nose and sinuses.  Nasal saline washes.  These rinses can help to get rid of thick mucus in your nose.  If your condition is caused by bacteria, you will be given an antibiotic medicine. If your condition is caused by a fungus, you will be given an antifungal medicine. Surgery may be needed to correct underlying conditions, such as narrow nasal passages. Surgery may also be needed to remove polyps. Follow these instructions at home: Medicines  Take, use, or apply over-the-counter and prescription medicines only as told by your health care provider. These may include nasal sprays.  If you were prescribed an antibiotic medicine, take it as told by your health care provider. Do not stop taking the antibiotic even if you start to feel better. Hydrate and Humidify  Drink enough water to keep your urine clear or pale yellow. Staying hydrated will help to thin your mucus.  Use a cool mist humidifier to keep the humidity level in your home above 50%.  Inhale steam for 10-15 minutes, 3-4 times a day or as told by your health care provider. You can do this in the bathroom while a hot shower is running.  Limit your exposure to cool or dry air. Rest  Rest as much as possible.  Sleep with your head raised (elevated).  Make sure to get enough sleep each night. General instructions  Apply a warm, moist washcloth to your face 3-4 times a day or as told by your health care provider. This will help with discomfort.  Wash your hands often with soap and water to reduce your exposure to viruses and other germs. If soap and water are not available, use hand sanitizer.  Do not smoke. Avoid being around people who are smoking (secondhand smoke).  Keep all follow-up visits as told by your health care provider. This is important. Contact a health care provider if:  You have a fever.  Your symptoms get worse.  Your symptoms do not improve within 10 days. Get help right away if:  You have a severe headache.  You have persistent  vomiting.  You have pain or swelling around your face or eyes.  You have vision problems.  You develop confusion.  Your neck is stiff.  You have trouble breathing. This information is not intended to replace advice given to you by your health care provider. Make sure you discuss any questions you have with your health care provider. Document Released: 07/05/2005 Document Revised: 02/29/2016 Document Reviewed: 04/30/2015 Elsevier Interactive Patient Education  2017 [employer].   IF you received an x-ray today, you will receive an invoice from [facility]. Please contact [facility] at [phone] with questions or concerns regarding your invoice.   IF you received labwork today, you will receive an invoice from [place]. Please contact [facility] at [phone] with questions or concerns regarding your invoice.   Our billing staff will not be able to assist you with questions regarding bills from these companies.  You will be contacted with the lab results as soon as they are available. The fastest way to get your results is to activate your My Chart  account. Instructions are located on the last page of this paperwork. If you have not heard from [place] regarding the results in 2 weeks, please contact this office.

## 2017-07-01 NOTE — Progress Notes (Signed)
Subjective:    Patient ID: Lori Shepherd, female    DOB: [birth year], 46 y.o.   MRN: [id number]  07/01/2017  Sinus Problem (pt states she has been having drainage and pressure for two weeks ) and Nasal Congestion (x 2 weeks)    HPI This 46 y.o. female presents for evaluation of sinus congestion.  Onset 1.5-2 weeks ago.  Son also sick.  No fever; no chills/sweats.  Constant PND and rhinorrhea; +nasal congestion.  No energy.  +scratchy throat; +L ear pain.  +cough mostly at night and morning.  Mucinex; Dayquil.     BP Readings from Last 3 Encounters:  07/01/17 110/62  04/13/17 114/72  10/19/15 116/62   Wt Readings from Last 3 Encounters:  07/01/17 132 lb (59.9 kg)  04/13/17 132 lb 9.6 oz (60.1 kg)  10/19/15 131 lb 12.8 oz (59.8 kg)   Immunization History  Administered Date(s) Administered  . DTaP 04/26/2012  . Influenza Whole 05/03/2012  . Influenza,inj,Quad PF,6+ Mos 04/13/2017  . PPD Test 04/13/2017  . Tdap 04/13/2017    Review of Systems  Constitutional: Negative for chills, diaphoresis, fatigue and fever.  HENT: Positive for congestion, ear pain, postnasal drip, rhinorrhea, sinus pressure and voice change. Negative for ear discharge, hearing loss, sinus pain, sore throat and trouble swallowing.   Respiratory: Positive for cough. Negative for shortness of breath and wheezing.   Cardiovascular: Negative for chest pain, palpitations and leg swelling.  Gastrointestinal: Negative for abdominal pain, constipation, diarrhea, nausea and vomiting.  Skin: Negative for rash.  Neurological: Positive for headaches. Negative for dizziness.    Past Medical History:  Diagnosis Date  . Abnormal Pap smear   . AMA (advanced maternal age) multigravida 92+   . Artificial insemination   . Condyloma   . Female infertility associated with anovulation   . Postpartum care following cesarean delivery (05/11/12) 05/12/2012   Past Surgical History:  Procedure Laterality Date  .  CESAREAN SECTION  05/11/2012   Procedure: CESAREAN SECTION;  Surgeon: Lovenia Kim, MD;  Location: [facility];  Service: Obstetrics;  Laterality: N/A;  . HYSTEROSCOPY     No Known Allergies Current Outpatient Medications on File Prior to Visit  Medication Sig Dispense Refill  . levothyroxine (SYNTHROID, LEVOTHROID) 50 MCG tablet TK 1 T PO D UTD  3  . valACYclovir (VALTREX) 1000 MG tablet TK 2 TS PO BID  6   No current facility-administered medications on file prior to visit.    Social History   Socioeconomic History  . Marital status: Divorced    Spouse name: Not on file  . Number of children: Not on file  . Years of education: Not on file  . Highest education level: Not on file  Social Needs  . Financial resource strain: Not on file  . Food insecurity - worry: Not on file  . Food insecurity - inability: Not on file  . Transportation needs - medical: Not on file  . Transportation needs - non-medical: Not on file  Occupational History  . Not on file  Tobacco Use  . Smoking status: Never Smoker  . Smokeless tobacco: Never Used  Substance and Sexual Activity  . Alcohol use: No    Alcohol/week: 0.0 oz  . Drug use: No  . Sexual activity: Yes  Other Topics Concern  . Not on file  Social History Narrative  . Not on file   Family History  Problem Relation Age of Onset  . Hypertension Mother   .  Migraines Mother   . Cancer Mother        melanoma  . Rheum arthritis Father   . Urolithiasis Father   . Diabetes Brother   . Cancer Paternal Grandmother        lung  . Cancer Paternal Grandfather        lung  . Heart attack Paternal Grandfather        Objective:    BP 110/62   Pulse 100   Temp 97.9 F (36.6 C) (Oral)   Resp 16   Ht 5' 6.14" (1.68 m)   Wt 132 lb (59.9 kg)   LMP 06/10/2017 (Approximate)   SpO2 98%   BMI 21.21 kg/m  Physical Exam  Constitutional: She is oriented to person, place, and time. She appears well-developed and well-nourished. No distress.   HENT:  Head: Normocephalic and atraumatic.  Right Ear: External ear and ear canal normal. Tympanic membrane is retracted.  Left Ear: External ear and ear canal normal. Tympanic membrane is retracted.  Nose: Mucosal edema and rhinorrhea present. Right sinus exhibits no maxillary sinus tenderness and no frontal sinus tenderness. Left sinus exhibits no maxillary sinus tenderness and no frontal sinus tenderness.  Mouth/Throat: Uvula is midline, oropharynx is clear and moist and mucous membranes are normal.  Eyes: Conjunctivae are normal. Pupils are equal, round, and reactive to light.  Neck: Normal range of motion. Neck supple.  Cardiovascular: Normal rate, regular rhythm and normal heart sounds. Exam reveals no gallop and no friction rub.  No murmur heard. Pulmonary/Chest: Effort normal and breath sounds normal. She has no wheezes. She has no rales.  Neurological: She is alert and oriented to person, place, and time.  Skin: She is not diaphoretic.  Psychiatric: She has a normal mood and affect. Her behavior is normal.  Nursing note and vitals reviewed.  No results found. Depression screen [facility] 2/9 07/01/2017 04/13/2017 10/19/2015  Decreased Interest 0 0 0  Down, Depressed, Hopeless 0 0 0  PHQ - 2 Score 0 0 0   Fall Risk  07/01/2017 04/13/2017  Falls in the past year? No No        Assessment & Plan:   1. Acute recurrent maxillary sinusitis    -New onset.  Rx for Amoxicillin and Atrovent nasal spray provided. -Recommend discontinuing Mucinex and Dayquil; start Pseudoephedrine OTC.   No orders of the defined types were placed in this encounter.  Meds ordered this encounter  Medications  . amoxicillin (AMOXIL) 500 MG tablet    Sig: Take 2 tablets (1,000 mg total) by mouth 2 (two) times daily.    Dispense:  40 tablet    Refill:  0  . ipratropium (ATROVENT) 0.03 % nasal spray    Sig: Place 2 sprays into both nostrils 2 (two) times daily.    Dispense:  30 mL    Refill:  0    No  Follow-up on file.   Tashia Leiterman Elayne Guerin, M.D. Primary Care at [facility] [address] [place],   [address] [phone] phone [phone] fax

## 2017-09-08 ENCOUNTER — Ambulatory Visit: Payer: Self-pay

## 2017-09-08 ENCOUNTER — Ambulatory Visit: Payer: Self-pay | Admitting: Urgent Care

## 2017-09-08 NOTE — Telephone Encounter (Signed)
   Reason for Disposition . [1] Sinus congestion (pressure, fullness) AND [2] present > 10 days  Answer Assessment - Initial Assessment Questions 1. LOCATION: "Where does it hurt?"      Sinus pressure 2. ONSET: "When did the sinus pain start?"  (e.g., hours, days)      Started last week 3. SEVERITY: "How bad is the pain?"   (Scale 1-10; mild, moderate or severe)   - MILD (1-3): doesn't interfere with normal activities    - MODERATE (4-7): interferes with normal activities (e.g., work or school) or awakens from sleep   - SEVERE (8-10): excruciating pain and patient unable to do any normal activities        5-6 4. RECURRENT SYMPTOM: "Have you ever had sinus problems before?" If so, ask: "When was the last time?" and "What happened that time?"      Yes - treated in December 5. NASAL CONGESTION: "Is the nose blocked?" If so, ask, "Can you open it or must you breathe through the mouth?"     Blocked 6. NASAL DISCHARGE: "Do you have discharge from your nose?" If so ask, "What color?"     Green 7. FEVER: "Do you have a fever?" If so, ask: "What is it, how was it measured, and when did it start?"      No 8. OTHER SYMPTOMS: "Do you have any other symptoms?" (e.g., sore throat, cough, earache, difficulty breathing)     Cough 9. PREGNANCY: "Is there any chance you are pregnant?" "When was your last menstrual period?"     No  Protocols used: SINUS PAIN OR CONGESTION-A-AH

## 2017-09-08 NOTE — Telephone Encounter (Signed)
Pt. Treated in December for sinus infection.Symptoms went away and have returned.

## 2017-09-08 NOTE — Telephone Encounter (Signed)
Patient was scheduled for 5pm appointment with Jaynee Eagles PA, appointment was cancelled.

## 2017-12-08 ENCOUNTER — Encounter: Payer: Self-pay | Admitting: Family Medicine

## 2019-03-16 ENCOUNTER — Encounter: Payer: Self-pay | Admitting: Internal Medicine

## 2019-03-16 ENCOUNTER — Ambulatory Visit (INDEPENDENT_AMBULATORY_CARE_PROVIDER_SITE_OTHER): Payer: BC Managed Care – PPO | Admitting: Internal Medicine

## 2019-03-16 ENCOUNTER — Other Ambulatory Visit: Payer: Self-pay

## 2019-03-16 VITALS — BP 114/78 | HR 78 | Ht 64.0 in | Wt 139.8 lb

## 2019-03-16 DIAGNOSIS — R002 Palpitations: Secondary | ICD-10-CM

## 2019-03-16 DIAGNOSIS — R0789 Other chest pain: Secondary | ICD-10-CM

## 2019-03-16 DIAGNOSIS — E781 Pure hyperglyceridemia: Secondary | ICD-10-CM | POA: Diagnosis not present

## 2019-03-16 DIAGNOSIS — Z8249 Family history of ischemic heart disease and other diseases of the circulatory system: Secondary | ICD-10-CM | POA: Diagnosis not present

## 2019-03-16 NOTE — Patient Instructions (Signed)
Medication Instructions:  Your physician recommends that you continue on your current medications as directed. Please refer to the Current Medication list given to you today.  If you need a refill on your cardiac medications before your next appointment, please call your pharmacy.   Lab work: Your physician recommends that you return for [place] as indicated prior to Exercise Stress Echo. You will need to go to the [place] [facility] testing on the date and time given to you. You will need to wear a mask and plan to self-quarantine from the time of the Covid swab until your stress echo. You may go between 8 am and 3:30 pm on your designated day.  If you have labs (blood work) drawn today and your tests are completely normal, you will receive your results only by: Marland Kitchen MyChart Message (if you have MyChart) OR . A paper copy in the mail If you have any lab test that is abnormal or we need to change your treatment, we will call you to review the results.  Testing/Procedures: Your physician has requested that you have a stress echocardiogram. For further information please visit [email or web address]. Please follow instruction sheet as given.   DO NOT drink or eat foods with caffeine for 24 hours before the test. (Chocolate, coffee, tea, decaf coffee/tea, or energy drinks)  DO NOT smoke for 4 hours before your test.  If you use an inhaler, bring it with you to the test.  Wear comfortable shoes and clothing. Women do not wear dresses.   Follow-Up: At [facility], you and your health needs are our priority.  As part of our continuing mission to provide you with exceptional heart care, we have created designated Provider Care Teams.  These Care Teams include your primary Cardiologist (physician) and Advanced Practice Providers (APPs -  Physician Assistants and Nurse Practitioners) who all work together to provide you with the care you need, when you need it. You will need a  follow up appointment in 3 months.  You may see DR Harrell Gave END or one of the following Advanced Practice Providers on your designated Care Team:   Murray Hodgkins, NP Christell Faith, PA-C . Marrianne Mood, PA-C  Any Other Special Instructions Will Be Listed Below (If Applicable).  Dr End recommends you decrease your consumption of caffeine.     Exercise Stress Echocardiogram  An exercise stress echocardiogram is a test that checks how well your heart is working. For this test, you will walk on a treadmill to make your heart beat faster. This test uses sound waves (ultrasound) and a computer to make pictures (images) of your heart. These pictures will be taken before you exercise and after you exercise. What happens before the procedure?  Follow instructions from your doctor about what you cannot eat or drink before the test.  Do not drink or eat anything that has caffeine in it. Stop having caffeine for 24 hours before the test.  Ask your doctor about changing or stopping your normal medicines. This is important if you take diabetes medicines or blood thinners. Ask your doctor if you should take your medicines with water before the test.  If you use an inhaler, bring it to the test.  Do not use any products that have nicotine or tobacco in them, such as cigarettes and e-cigarettes. Stop using them for 4 hours before the test. If you need help quitting, ask your doctor.  Wear comfortable shoes and clothing. What happens during the  procedure?  You will be hooked up to a TV screen. Your doctor will watch the screen to see how fast your heart beats during the test.  Before you exercise, a computer will make a picture of your heart. To do this: ? A gel will be put on your chest. ? A wand will be moved over the gel. ? Sound waves from the wand will go to the computer to make the picture.  Your will start walking on a treadmill. The treadmill will start at a slow speed. It will get  faster a little bit at a time. When you walk faster, your heart will beat faster.  The treadmill will be stopped when your heart is working hard.  You will lie down right away so another picture of your heart can be taken.  The test will take 30-60 minutes. What happens after the procedure?  Your heart rate and blood pressure will be watched after the test.  If your doctor says that you can, you may: ? Eat what you usually eat. ? Do your normal activities. ? Take medicines like normal. Summary  An exercise stress echocardiogram is a test that checks how well your heart is working.  Follow instructions about what you cannot eat or drink before the test. Ask your doctor if you should take your normal medicines before the test.  Stop having caffeine for 24 hours before the test. Do not use anything with nicotine or tobacco in it for 4 hours before the test.  A computer will take a picture of your heart before you walk on a treadmill. It will take another picture when you are done walking.  Your heart rate and blood pressure will be watched after the test. This information is not intended to replace advice given to you by your health care provider. Make sure you discuss any questions you have with your health care provider. Document Released: 05/02/2009 Document Revised: 04/29/2016 Document Reviewed: 03/28/2016 Elsevier Patient Education  2020 [employer].

## 2019-03-16 NOTE — Progress Notes (Signed)
New Outpatient Visit Date: 03/16/2019  Referring Provider: Azucena Fallen, MD [place],  [place] [address]  Chief Complaint: Palpitations and chest paoin  HPI:  Lori Shepherd is a 48 y.o. female who is being seen today for the evaluation of palpitations and chest pain in the setting of a family history of heart diseaseat the request of Azucena Fallen, MD. She has a history of thyroid disease.  Lori Shepherd reports noticing a brief flutter in her chest followed by a pause and a very forceful heart beat.  It typically occurs in the evening when resting, several days per week.  There are no associated symptoms or obvious triggers.  Lori Shepherd also notices intermittent chest discomfort that is unrelated to the aforementioned palpitations.  She describes the feeling as "a ball of tightness" in the center of her chest.  The discomfort usually lasts 5-10 minutes and is not exertional.  She feels like turning a certain way sometimes brings it on.  Her heart sometimes seems to beat faster during these episodes.  Otherwise, there are no associated symptoms.  She drinks coffee and soda most days but does not link her chest pain or palpitations to caffeine consumption.  She notes that her thyroid function has been "up and down."  A small adjustment to her thyroid supplementation was made after her last visit with Dr. Benjie Karvonen in June.  Lori Shepherd denies a personal history of cardiac disease or testing, though she notes a strong family history.  In particular, she is concerned about her father's history of CAD requiring CABG at age 73.  --------------------------------------------------------------------------------------------------  Cardiovascular History & Procedures: Cardiovascular Problems:  Chest pain  Palpitations  Risk Factors:  Family history and hyperlipidemia  Cath/PCI:  None  CV Surgery:  None  EP Procedures and Devices:  None  Non-Invasive Evaluation(s):  None   --------------------------------------------------------------------------------------------------  Past Medical History:  Diagnosis Date  . Abnormal Pap smear   . AMA (advanced maternal age) multigravida 100+   . Artificial insemination   . Condyloma   . Female infertility associated with anovulation   . Postpartum care following cesarean delivery (05/11/12) 05/12/2012  . Thyroid disease     Past Surgical History:  Procedure Laterality Date  . CESAREAN SECTION  05/11/2012   Procedure: CESAREAN SECTION;  Surgeon: Lovenia Kim, MD;  Location: [facility];  Service: Obstetrics;  Laterality: N/A;  . HYSTEROSCOPY      Current Meds  Medication Sig  . Biotin w/ Vitamins C & E (HAIR/SKIN/NAILS PO) Take by mouth daily.  Marland Kitchen levothyroxine (SYNTHROID, LEVOTHROID) 50 MCG tablet Take 50 mcg by mouth daily.   Marland Kitchen LYSINE PO Take by mouth daily.  . valACYclovir (VALTREX) 1000 MG tablet as needed.     Allergies: Patient has no known allergies.  Social History   Tobacco Use  . Smoking status: Never Smoker  . Smokeless tobacco: Never Used  Substance Use Topics  . Alcohol use: Yes    Alcohol/week: 0.0 standard drinks    Comment: occasional glass of wine (<1x/week)  . Drug use: No    Family History  Problem Relation Age of Onset  . Hypertension Mother   . Migraines Mother   . Cancer Mother        melanoma  . Arrhythmia Mother   . Hyperlipidemia Mother   . Rheum arthritis Father   . Urolithiasis Father   . Coronary artery disease Father 51  . Diabetes Brother   . Cancer Paternal Grandmother  lung  . Heart attack Paternal Grandmother   . Cancer Paternal Grandfather        lung  . Heart attack Paternal Grandfather   . Heart Problems Paternal Uncle   . Heart disease Paternal Aunt     Review of Systems: A 12-system review of systems was performed and was negative except as noted in the HPI.   --------------------------------------------------------------------------------------------------  Physical Exam: BP 114/78 (BP Location: Right Arm, Patient Position: Sitting, Cuff Size: Normal)   Pulse 78   Ht 5\' 4"  (1.626 m)   Wt 139 lb 12 oz (63.4 kg)   BMI 23.99 kg/m   General:  NAD HEENT: No conjunctival pallor or scleral icterus. Facemask in place Neck: Supple without lymphadenopathy, thyromegaly, JVD, or HJR. No carotid bruit. Lungs: Normal work of breathing. Clear to auscultation bilaterally without wheezes or crackles. Heart: Regular rate and rhythm without murmurs, rubs, or gallops. Non-displaced PMI. Abd: Bowel sounds present. Soft, NT/ND without hepatosplenomegaly Ext: No lower extremity edema. Radial, PT, and DP pulses are 2+ bilaterally Skin: Warm and dry without rash. Neuro: CNIII-XII intact. Strength and fine-touch sensation intact in upper and lower extremities bilaterally. Psych: Normal mood and affect.  EKG:  Normal sinus rhythm with sinus arrhythmia and non-specific ST segment changes.  Lab Results  Component Value Date   WBC 6.8 04/13/2017   HGB 12.1 04/13/2017   HCT 38.7 04/13/2017   MCV 93 04/13/2017   PLT 254 04/13/2017    Lab Results  Component Value Date   NA 139 04/13/2017   K 4.0 04/13/2017   CL 105 04/13/2017   CO2 20 04/13/2017   BUN 10 04/13/2017   CREATININE 0.78 04/13/2017   GLUCOSE 82 04/13/2017   ALT 8 04/13/2017    Lab Results  Component Value Date   CHOL 147 04/13/2017   HDL 58 04/13/2017   LDLCALC 77 04/13/2017   TRIG 61 04/13/2017   CHOLHDL 2.5 04/13/2017   Outside labs (12/28/2018): Lipid panel: TC 157, TG 240, HDL 44, LDL 65  TSH: 6.45  CBC: WBC 8.1, HGB 12.8, HCT 38.7, PLT 249  CMP: Na 135, K 4.1, Cl 103, CO2 18, BUN 12, creatinine 0.8, Ca 9.2, glucose 80, AST 13, ALT 9, ALP 67, Tbili <0.2, TP 6.9, albumin 4.4  --------------------------------------------------------------------------------------------------   ASSESSMENT AND PLAN: Atypical chest pain: Pain is not exertional.  Cardiac risk factors including family history and recently discovered hyperlipidemia (elevated triglycerides).  EKG also shows subtle non-specific ST segment changes.  I have recommended an exercise stress echocardiogram.  If this is normal, we will obtain a coronary calcium score CT for further risk stratification, given her family history of early cardiac disease.  Palpitations: Symptoms are most consistent with PAC's or PVC's.  I have encouraged Ms. Franchina to minimize her caffeine intake.  We will proceed with exercise stress echo, as above.  We have agreed to defer ambulatory cardiac monitoring for now.  Continued management of thyroid disease, per Dr. Benjie Karvonen.  Hypertriglyceridemia: Recent labs obtained by Dr. Benjie Karvonen are notable for triglycerides of 240.  I recommend lifestyle modifications, including diet and exercise.  If triglycerides remain elevated, fish oil supplementation could be considered in the future.  Follow-up: RTC 3 months.  Nelva Bush, MD 03/17/2019 11:43 AM

## 2019-03-17 ENCOUNTER — Encounter: Payer: Self-pay | Admitting: Internal Medicine

## 2019-03-17 DIAGNOSIS — Z8249 Family history of ischemic heart disease and other diseases of the circulatory system: Secondary | ICD-10-CM | POA: Insufficient documentation

## 2019-03-17 DIAGNOSIS — E781 Pure hyperglyceridemia: Secondary | ICD-10-CM | POA: Insufficient documentation

## 2019-03-17 DIAGNOSIS — R002 Palpitations: Secondary | ICD-10-CM | POA: Insufficient documentation

## 2019-03-17 DIAGNOSIS — R0789 Other chest pain: Secondary | ICD-10-CM | POA: Insufficient documentation

## 2019-04-09 ENCOUNTER — Other Ambulatory Visit: Admission: RE | Admit: 2019-04-09 | Payer: BC Managed Care – PPO | Source: Ambulatory Visit

## 2019-04-12 ENCOUNTER — Other Ambulatory Visit: Payer: BC Managed Care – PPO

## 2019-06-11 ENCOUNTER — Telehealth: Payer: Self-pay | Admitting: Internal Medicine

## 2019-06-11 NOTE — Telephone Encounter (Signed)
l mom to call and verify if patient wants to r/s stress echo. Pt has an appt with dr end on 12/2.

## 2019-06-12 NOTE — Telephone Encounter (Signed)
Patient declined and also declined ov at this time.  She stated she discussed with Dr. Saunders Revel at visit and this was ok to wait until she is ready.  She will call back if she decides to reschedule in the future.

## 2019-06-20 ENCOUNTER — Ambulatory Visit: Payer: BC Managed Care – PPO | Admitting: Internal Medicine

## 2021-02-17 ENCOUNTER — Encounter: Payer: Self-pay | Admitting: *Deleted

## 2021-02-19 ENCOUNTER — Telehealth (INDEPENDENT_AMBULATORY_CARE_PROVIDER_SITE_OTHER): Payer: Self-pay | Admitting: Gastroenterology

## 2021-02-19 DIAGNOSIS — Z1211 Encounter for screening for malignant neoplasm of colon: Secondary | ICD-10-CM

## 2021-02-19 MED ORDER — CLENPIQ 10-3.5-12 MG-GM -GM/160ML PO SOLN
1.0000 | Freq: Once | ORAL | 0 refills | Status: AC
Start: 2021-02-19 — End: 2021-02-19

## 2021-02-19 NOTE — Progress Notes (Signed)
Gastroenterology Pre-Procedure Review  Request Date: 03/03/21 Requesting Physician: Dr. Bonna Gains  PATIENT REVIEW QUESTIONS: The patient responded to the following health history questions as indicated:    1. Are you having any GI issues? no 2. Do you have a personal history of Polyps? no 3. Do you have a family history of Colon Cancer or Polyps? no 4. Diabetes Mellitus? no 5. Joint replacements in the past 12 months?no 6. Major health problems in the past 3 months?no 7. Any artificial heart valves, MVP, or defibrillator?no    MEDICATIONS & ALLERGIES:    Patient reports the following regarding taking any anticoagulation/antiplatelet therapy:   Plavix, Coumadin, Eliquis, Xarelto, Lovenox, Pradaxa, Brilinta, or Effient? no Aspirin? no  Patient confirms/reports the following medications:  Current Outpatient Medications  Medication Sig Dispense Refill   Biotin w/ Vitamins C & E (HAIR/SKIN/NAILS PO) Take by mouth daily.     levothyroxine (SYNTHROID, LEVOTHROID) 50 MCG tablet Take 50 mcg by mouth daily.   3   LYSINE PO Take by mouth daily.     valACYclovir (VALTREX) 1000 MG tablet as needed.   6   No current facility-administered medications for this visit.    Patient confirms/reports the following allergies:  No Known Allergies  No orders of the defined types were placed in this encounter.   AUTHORIZATION INFORMATION Primary Insurance: 1D#: Group #:  Secondary Insurance: 1D#: Group #:  SCHEDULE INFORMATION: Date: 03/03/21 Time: Location: [place]

## 2021-03-03 ENCOUNTER — Encounter: Payer: Self-pay | Admitting: Gastroenterology

## 2021-03-03 ENCOUNTER — Ambulatory Visit: Payer: BC Managed Care – PPO | Admitting: Certified Registered Nurse Anesthetist

## 2021-03-03 ENCOUNTER — Other Ambulatory Visit: Payer: Self-pay

## 2021-03-03 ENCOUNTER — Ambulatory Visit
Admission: RE | Admit: 2021-03-03 | Discharge: 2021-03-03 | Disposition: A | Discharge status: Home / Self Care | Payer: BC Managed Care – PPO | Attending: Gastroenterology | Admitting: Gastroenterology

## 2021-03-03 ENCOUNTER — Encounter
Admission: RE | Disposition: A | Discharge status: Home / Self Care | Payer: Self-pay | Source: Home / Self Care | Attending: Gastroenterology

## 2021-03-03 DIAGNOSIS — Z79899 Other long term (current) drug therapy: Secondary | ICD-10-CM | POA: Diagnosis not present

## 2021-03-03 DIAGNOSIS — D123 Benign neoplasm of transverse colon: Secondary | ICD-10-CM | POA: Insufficient documentation

## 2021-03-03 DIAGNOSIS — K635 Polyp of colon: Secondary | ICD-10-CM

## 2021-03-03 DIAGNOSIS — Z79890 Hormone replacement therapy (postmenopausal): Secondary | ICD-10-CM | POA: Insufficient documentation

## 2021-03-03 DIAGNOSIS — Z1211 Encounter for screening for malignant neoplasm of colon: Secondary | ICD-10-CM

## 2021-03-03 HISTORY — DX: Thyrotoxicosis, unspecified without thyrotoxic crisis or storm: E05.90

## 2021-03-03 HISTORY — DX: Anxiety disorder, unspecified: F41.9

## 2021-03-03 HISTORY — PX: COLONOSCOPY WITH PROPOFOL: SHX5780

## 2021-03-03 LAB — POCT PREGNANCY, URINE: Preg Test, Ur: NEGATIVE

## 2021-03-03 SURGERY — COLONOSCOPY WITH PROPOFOL
Anesthesia: General

## 2021-03-03 MED ORDER — PROPOFOL 500 MG/50ML IV EMUL
INTRAVENOUS | Status: DC | PRN
  Administered 2021-03-03: 140 ug/kg/min via INTRAVENOUS

## 2021-03-03 MED ORDER — DEXMEDETOMIDINE (PRECEDEX) IN NS 20 MCG/5ML (4 MCG/ML) IV SYRINGE
PREFILLED_SYRINGE | INTRAVENOUS | Status: DC | PRN
  Administered 2021-03-03: 8 ug via INTRAVENOUS

## 2021-03-03 MED ORDER — LIDOCAINE HCL (CARDIAC) PF 100 MG/5ML IV SOSY
PREFILLED_SYRINGE | INTRAVENOUS | Status: DC | PRN
  Administered 2021-03-03: 100 mg via INTRAVENOUS

## 2021-03-03 MED ORDER — PROPOFOL 10 MG/ML IV BOLUS
INTRAVENOUS | Status: DC | PRN
  Administered 2021-03-03: 50 mg via INTRAVENOUS
  Administered 2021-03-03: 30 mg via INTRAVENOUS
  Administered 2021-03-03: 70 mg via INTRAVENOUS
  Administered 2021-03-03: 30 mg via INTRAVENOUS

## 2021-03-03 MED ORDER — SODIUM CHLORIDE 0.9 % IV SOLN: INTRAVENOUS | Status: DC

## 2021-03-03 NOTE — Op Note (Signed)
[facility] Gastroenterology Patient Name: Lori Shepherd Procedure Date: 03/03/2021 10:22 AM MRN: [id number] Account #: [id number] Date of Birth: 10-13-1970 Admit Type: Outpatient Age: 50 Room: [facility] ENDO ROOM 3 Gender: Female Note Status: Finalized Procedure:             Colonoscopy Indications:           Screening for colorectal malignant neoplasm Providers:             Luka Reisch B. Bonna Gains MD, MD Referring MD:          Rondel Oh. Benjie Karvonen (Referring MD) Medicines:             Monitored Anesthesia Care Complications:         No immediate complications. Procedure:             Pre-Anesthesia Assessment:                        - ASA Grade Assessment: II - A patient with mild                         systemic disease.                        - Prior to the procedure, a History and Physical was                         performed, and patient medications, allergies and                         sensitivities were reviewed. The patient's tolerance                         of previous anesthesia was reviewed.                        - The risks and benefits of the procedure and the                         sedation options and risks were discussed with the                         patient. All questions were answered and informed                         consent was obtained.                        - Patient identification and proposed procedure were                         verified prior to the procedure by the physician, the                         nurse, the anesthesiologist, the anesthetist and the                         technician. The procedure was verified in the                         procedure room.  After obtaining informed consent, the colonoscope was                         passed under direct vision. Throughout the procedure,                         the patient's blood pressure, pulse, and oxygen                         saturations were  monitored continuously. The                         Colonoscope was introduced through the anus and                         advanced to the the cecum, identified by appendiceal                         orifice and ileocecal valve. The colonoscopy was                         performed with ease. The patient tolerated the                         procedure well. The quality of the bowel preparation                         was fair. Findings:      The perianal and digital rectal examinations were normal.      A 7 mm polyp was found in the transverse colon. The polyp was sessile.       The polyp was removed with a cold snare. Resection and retrieval were       complete.      The exam was otherwise without abnormality.      The rectum, sigmoid colon, descending colon, transverse colon, ascending       colon and cecum appeared normal.      The retroflexed view of the distal rectum and anal verge was normal and       showed no anal or rectal abnormalities. Impression:            - Preparation of the colon was fair.                        - One 7 mm polyp in the transverse colon, removed with                         a cold snare. Resected and retrieved.                        - The examination was otherwise normal.                        - The rectum, sigmoid colon, descending colon,                         transverse colon, ascending colon and cecum are normal.                        -  The distal rectum and anal verge are normal on                         retroflexion view. Recommendation:        - Await pathology results.                        - Discharge patient to home (with escort).                        - Advance diet as tolerated.                        - Continue present medications.                        - Repeat colonoscopy in 1 year, with 2 day prep                         because the bowel preparation was suboptimal.                        - The findings and recommendations were  discussed with                         the patient.                        - The findings and recommendations were discussed with                         the patient's family.                        - Return to primary care physician as previously                         scheduled. Procedure Code(s):     --- Professional ---                        [phone], Colonoscopy, flexible; with removal of                         tumor(s), polyp(s), or other lesion(s) by snare                         technique Diagnosis Code(s):     --- Professional ---                        Z12.11, Encounter for screening for malignant neoplasm                         of colon                        K63.5, Polyp of colon CPT copyright 2019 American Medical Association. All rights reserved. The codes documented in this report are preliminary and upon coder review may  be revised to meet current compliance requirements.  Vonda Antigua, MD Margretta Sidle B. Mavryk Pino MD, MD 03/03/2021 10:50:35 AM This report has been signed  electronically. Number of Addenda: 0 Note Initiated On: 03/03/2021 10:22 AM Scope Withdrawal Time: 0 hours 10 minutes 14 seconds  Total Procedure Duration: 0 hours 14 minutes 53 seconds  Estimated Blood Loss:  Estimated blood loss: none.      [facility]

## 2021-03-03 NOTE — Anesthesia Preprocedure Evaluation (Signed)
Anesthesia Evaluation  Patient identified by MRN, date of birth, ID band Patient awake    Reviewed: Allergy & Precautions, H&P , NPO status , Patient's Chart, lab work & pertinent test results  Airway Mallampati: III  TM Distance: >3 FB Neck ROM: full    Dental no notable dental hx.    Pulmonary neg pulmonary ROS,    Pulmonary exam normal        Cardiovascular Exercise Tolerance: Good negative cardio ROS Normal cardiovascular exam     Neuro/Psych Depression negative neurological ROS     GI/Hepatic negative GI ROS, Neg liver ROS,   Endo/Other  Hypothyroidism   Renal/GU negative Renal ROS  negative genitourinary   Musculoskeletal   Abdominal Normal abdominal exam  (+)   Peds  Hematology negative hematology ROS (+)   Anesthesia Other Findings   Reproductive/Obstetrics negative OB ROS                            Anesthesia Physical Anesthesia Plan  ASA: 2  Anesthesia Plan: General   Post-op Pain Management:    Induction:   PONV Risk Score and Plan: 3 and Propofol infusion and TIVA  Airway Management Planned:   Additional Equipment:   Intra-op Plan:   Post-operative Plan:   Informed Consent: I have reviewed the patients History and Physical, chart, labs and discussed the procedure including the risks, benefits and alternatives for the proposed anesthesia with the patient or authorized representative who has indicated his/her understanding and acceptance.     Dental Advisory Given  Plan Discussed with: Anesthesiologist and CRNA  Anesthesia Plan Comments:        Anesthesia Quick Evaluation

## 2021-03-03 NOTE — Anesthesia Procedure Notes (Signed)
Date/Time: 03/03/2021 10:25 AM Performed by: Lily Peer, Athalene Kolle, CRNA Pre-anesthesia Checklist: Patient identified, Emergency Drugs available, Suction available, Patient being monitored and Timeout performed Patient Re-evaluated:Patient Re-evaluated prior to induction Oxygen Delivery Method: Nasal cannula Induction Type: IV induction

## 2021-03-03 NOTE — Transfer of Care (Signed)
Immediate Anesthesia Transfer of Care Note  Patient: Lori Shepherd  Procedure(s) Performed: COLONOSCOPY WITH PROPOFOL  Patient Location: Endoscopy Unit  Anesthesia Type:General  Level of Consciousness: awake, alert  and oriented  Airway & Oxygen Therapy: Patient Spontanous Breathing  Post-op Assessment: Report given to RN and Post -op Vital signs reviewed and stable  Post vital signs: Reviewed and stable  Last Vitals:  Vitals Value Taken Time  BP 88/54 03/03/21 1052  Temp    Pulse 78 03/03/21 1053  Resp 16 03/03/21 1053  SpO2 98 % 03/03/21 1053  Vitals shown include unvalidated device data.  Last Pain:  Vitals:   03/03/21 1009  TempSrc: Temporal  PainSc: 0-No pain         Complications: No notable events documented.

## 2021-03-03 NOTE — H&P (Signed)
Vonda Antigua, MD [address] [place], [place], [place], [address] [address], [place], [place], [place], [address] Phone: [phone]  Fax: [phone]  Primary Care Physician:  Azucena Fallen, MD   Pre-Procedure History & Physical: HPI:  Lori Shepherd is a 50 y.o. female is here for a colonoscopy.   Past Medical History:  Diagnosis Date   Abnormal Pap smear    AMA (advanced maternal age) multigravida 35+    Anxiety    Artificial insemination    Condyloma    Female infertility associated with anovulation    Hyperthyroidism    Postpartum care following cesarean delivery (05/11/12) 05/12/2012   Thyroid disease     Past Surgical History:  Procedure Laterality Date   CESAREAN SECTION  05/11/2012   Procedure: CESAREAN SECTION;  Surgeon: Lovenia Kim, MD;  Location: [facility];  Service: Obstetrics;  Laterality: N/A;   HYSTEROSCOPY      Prior to Admission medications   Medication Sig Start Date End Date Taking? Authorizing Provider  ALPRAZolam Duanne Moron) 1 MG tablet Take 1 mg by mouth 2 (two) times daily as needed. 02/06/21  Yes [provider]  Biotin w/ Vitamins C & E (HAIR/SKIN/NAILS PO) Take by mouth daily.   Yes [provider]  levothyroxine (SYNTHROID) 75 MCG tablet Take 75 mcg by mouth daily. 01/20/21  Yes [provider]  LO LOESTRIN FE 1 MG-10 MCG / 10 MCG tablet Take 1 tablet by mouth daily. 01/20/21  Yes [provider]  LYSINE PO Take by mouth daily.   Yes [provider]  sertraline (ZOLOFT) 100 MG tablet Take 100 mg by mouth daily. 02/06/21  Yes [provider]  valACYclovir (VALTREX) 1000 MG tablet as needed.  06/03/17  Yes [provider]  levothyroxine (SYNTHROID, LEVOTHROID) 50 MCG tablet Take 50 mcg by mouth daily.  Patient not taking: No sig reported 06/03/17   [provider]    Allergies as of 02/19/2021   (No Known Allergies)    Family History  Problem Relation Age  of Onset   Hypertension Mother    Migraines Mother    Cancer Mother        melanoma   Arrhythmia Mother    Hyperlipidemia Mother    Rheum arthritis Father    Urolithiasis Father    Coronary artery disease Father 69   Diabetes Brother    Cancer Paternal Grandmother        lung   Heart attack Paternal Grandmother    Cancer Paternal Grandfather        lung   Heart attack Paternal Grandfather    Heart Problems Paternal Uncle    Heart disease Paternal Aunt     Social History   Socioeconomic History   Marital status: Divorced    Spouse name: Not on file   Number of children: Not on file   Years of education: Not on file   Highest education level: Not on file  Occupational History   Not on file  Tobacco Use   Smoking status: Never   Smokeless tobacco: Never  Substance and Sexual Activity   Alcohol use: Yes    Alcohol/week: 0.0 standard drinks    Comment: occasional glass of wine (<1x/week)   Drug use: No   Sexual activity: Yes  Other Topics Concern   Not on file  Social History Narrative   Not on file   Social Determinants of Health   Financial Resource Strain: Not on file  Food Insecurity: Not on file  Transportation Needs: Not on file  Physical Activity: Not on file  Stress: Not on file  Social Connections: Not on file  Intimate Partner Violence: Not on file    Review of Systems: See HPI, otherwise negative ROS  Physical Exam: Constitutional: General:   Alert,  Well-developed, well-nourished, pleasant and cooperative in NAD BP (!) 145/89   Pulse 76   Temp 97.6 F (36.4 C) (Temporal)   Resp 15   Ht '5\' 3"'$  (1.6 m)   Wt 61.2 kg   SpO2 99%   BMI 23.91 kg/m   Head: Normocephalic, atraumatic.   Eyes:  Sclera clear, no icterus.   Conjunctiva pink.   Mouth:  No deformity or lesions, oropharynx pink & moist.  Neck:  Supple, trachea midline  Respiratory: Normal respiratory effort  Gastrointestinal:  Soft, non-tender and non-distended without masses,  hepatosplenomegaly or hernias noted.  No guarding or rebound tenderness.     Cardiac: No clubbing or edema.  No cyanosis. Normal posterior tibial pedal pulses noted.  Lymphatic:  No significant cervical adenopathy.  Psych:  Alert and cooperative. Normal mood and affect.  Musculoskeletal:   Symmetrical without gross deformities. 5/5 Lower extremity strength bilaterally.  Skin: Warm. Intact without significant lesions or rashes. No jaundice.  Neurologic:  Face symmetrical, tongue midline, Normal sensation to touch;  grossly normal neurologically.  Psych:  Alert and oriented x3, Alert and cooperative. Normal mood and affect.  Impression/Plan: MAYELY SPROLES is here for a colonoscopy to be performed for average risk screening.  Risks, benefits, limitations, and alternatives regarding  colonoscopy have been reviewed with the patient.  Questions have been answered.  All parties agreeable.   Virgel Manifold, MD  03/03/2021, 10:17 AM

## 2021-03-04 ENCOUNTER — Encounter: Payer: Self-pay | Admitting: Gastroenterology

## 2021-03-04 LAB — SURGICAL PATHOLOGY

## 2021-03-04 NOTE — Anesthesia Postprocedure Evaluation (Signed)
Anesthesia Post Note  Patient: Lori Shepherd  Procedure(s) Performed: COLONOSCOPY WITH PROPOFOL  Patient location during evaluation: Endoscopy Anesthesia Type: General Level of consciousness: awake and alert Pain management: pain level controlled Vital Signs Assessment: post-procedure vital signs reviewed and stable Respiratory status: spontaneous breathing, nonlabored ventilation and respiratory function stable Cardiovascular status: blood pressure returned to baseline and stable Postop Assessment: no apparent nausea or vomiting Anesthetic complications: no   No notable events documented.   Last Vitals:  Vitals:   03/03/21 1110 03/03/21 1120  BP: 103/68 109/74  Pulse: 68 72  Resp: 14 12  Temp:    SpO2: 99% 100%    Last Pain:  Vitals:   03/03/21 1009  TempSrc: Temporal  PainSc: 0-No pain                 [place] Ouch
